# Patient Record
Sex: Female | Born: 1985 | Race: Black or African American | Hispanic: No | Marital: Single | Smoking: Never smoker
Health system: Southern US, Community
[De-identification: ages and names within clinical notes are randomized; demographics above are authoritative.]

## PROBLEM LIST (undated history)

## (undated) ENCOUNTER — Inpatient Hospital Stay (HOSPITAL_COMMUNITY): Payer: Self-pay

## (undated) DIAGNOSIS — G43909 Migraine, unspecified, not intractable, without status migrainosus: Secondary | ICD-10-CM

## (undated) DIAGNOSIS — E7800 Pure hypercholesterolemia, unspecified: Secondary | ICD-10-CM

## (undated) HISTORY — PX: WISDOM TOOTH EXTRACTION: SHX21

---

## 2014-06-01 ENCOUNTER — Encounter (HOSPITAL_COMMUNITY): Payer: Self-pay | Admitting: *Deleted

## 2014-06-01 ENCOUNTER — Inpatient Hospital Stay (HOSPITAL_COMMUNITY)
Admission: AD | Admit: 2014-06-01 | Discharge: 2014-06-01 | Disposition: A | Discharge status: Home / Self Care | Payer: Medicaid - Out of State | Source: Ambulatory Visit | Attending: Obstetrics & Gynecology | Admitting: Obstetrics & Gynecology

## 2014-06-01 DIAGNOSIS — R1013 Epigastric pain: Secondary | ICD-10-CM | POA: Diagnosis present

## 2014-06-01 DIAGNOSIS — N898 Other specified noninflammatory disorders of vagina: Secondary | ICD-10-CM | POA: Insufficient documentation

## 2014-06-01 DIAGNOSIS — R197 Diarrhea, unspecified: Secondary | ICD-10-CM

## 2014-06-01 HISTORY — DX: Pure hypercholesterolemia, unspecified: E78.00

## 2014-06-01 LAB — CBC WITH DIFFERENTIAL/PLATELET
Basophils Absolute: 0 10*3/uL (ref 0.0–0.1)
Basophils Relative: 0 % (ref 0–1)
Eosinophils Absolute: 0.4 10*3/uL (ref 0.0–0.7)
Eosinophils Relative: 8 % — ABNORMAL HIGH (ref 0–5)
HEMATOCRIT: 38.4 % (ref 36.0–46.0)
HEMOGLOBIN: 12.9 g/dL (ref 12.0–15.0)
LYMPHS PCT: 47 % — AB (ref 12–46)
Lymphs Abs: 2.5 10*3/uL (ref 0.7–4.0)
MCH: 27.9 pg (ref 26.0–34.0)
MCHC: 33.6 g/dL (ref 30.0–36.0)
MCV: 83.1 fL (ref 78.0–100.0)
MONO ABS: 0.4 10*3/uL (ref 0.1–1.0)
MONOS PCT: 7 % (ref 3–12)
Neutro Abs: 2.1 10*3/uL (ref 1.7–7.7)
Neutrophils Relative %: 38 % — ABNORMAL LOW (ref 43–77)
Platelets: 245 10*3/uL (ref 150–400)
RBC: 4.62 MIL/uL (ref 3.87–5.11)
RDW: 15.7 % — ABNORMAL HIGH (ref 11.5–15.5)
WBC: 5.4 10*3/uL (ref 4.0–10.5)

## 2014-06-01 LAB — URINALYSIS, ROUTINE W REFLEX MICROSCOPIC
BILIRUBIN URINE: NEGATIVE
GLUCOSE, UA: NEGATIVE mg/dL
Ketones, ur: NEGATIVE mg/dL
Leukocytes, UA: NEGATIVE
Nitrite: NEGATIVE
PROTEIN: NEGATIVE mg/dL
Specific Gravity, Urine: 1.02 (ref 1.005–1.030)
Urobilinogen, UA: 0.2 mg/dL (ref 0.0–1.0)
pH: 6.5 (ref 5.0–8.0)

## 2014-06-01 LAB — WET PREP, GENITAL
Trich, Wet Prep: NONE SEEN
YEAST WET PREP: NONE SEEN

## 2014-06-01 LAB — URINE MICROSCOPIC-ADD ON

## 2014-06-01 LAB — POCT PREGNANCY, URINE: PREG TEST UR: NEGATIVE

## 2014-06-01 LAB — HIV ANTIBODY (ROUTINE TESTING W REFLEX): HIV: NONREACTIVE

## 2014-06-01 MED ORDER — DICYCLOMINE HCL 10 MG PO CAPS
20.0000 mg | ORAL_CAPSULE | Freq: Once | ORAL | Status: AC
  Administered 2014-06-01: 20 mg via ORAL
  Filled 2014-06-01: qty 2

## 2014-06-01 MED ORDER — DICYCLOMINE HCL 20 MG PO TABS
20.0000 mg | ORAL_TABLET | Freq: Once | ORAL | Status: DC
  Filled 2014-06-01: qty 1

## 2014-06-01 MED ORDER — DICYCLOMINE HCL 20 MG PO TABS: 20.0000 mg | ORAL_TABLET | Freq: Four times a day (QID) | ORAL | Status: DC

## 2014-06-01 NOTE — Discharge Instructions (Signed)

## 2014-06-01 NOTE — MAU Note (Signed)
Also notes chest heaviness when lying down at night. Helps when pt lifts up her breasts and inhales deeply. Unsure if pain is r/t constipation.

## 2014-06-01 NOTE — MAU Note (Addendum)
Pt states here for sharp intermittent abd pain. Has been constipated and used enema with minimal results. Then took mag citrate and has had watery stools, however nothing solid. Has nexplanon and cycles went from every 28 days to every 18. Also has vaginal discharge with a slight odor. Was light brown, now is a chocolate color. Back pain as well that began today. Denies uti s/s.

## 2014-06-01 NOTE — MAU Provider Note (Signed)
History     CSN: [id number]  Arrival date and time: 06/01/14 1721   None     Chief Complaint  Patient presents with  . Abdominal Pain  . Vaginal Discharge   HPI  Pt presents with diffuse abd pain and midline epigastric pain onset 5d ago. Pt reports abd distension and constipation onset 5d ago, minimal relief with laxative 5d ago and magnesium citrate taken 3d ago. Pt reports mag citrate experienced profuse water diarrhea with minimal stool. Pt denies n/v, but reports unable to tolerate PO intake due to feeling of fullness. Pt reports abd distension "sometimes it makes me short of breath but when I burp it gets better". Pt denies fever/chills, hematuria, dysuria, melena.  Pt also reports new sexual contact 5d ago and experienced pain during unprotected sex. Pt then reports brown thin foul smelling vaginal discharge. Pt denies pelvic or vaginal pain. Pt reports previously had irregular vaginal bleeding.   OB History    Gravida Para Term Preterm AB TAB SAB Ectopic Multiple Living   0 0 0 0 0 0 0 0 0 0       Past Medical History  Diagnosis Date  . Hypercholesteremia     Past Surgical History  Procedure Laterality Date  . Wisdom tooth extraction      History reviewed. No pertinent family history.  History  Substance Use Topics  . Smoking status: Never Smoker   . Smokeless tobacco: Not on file  . Alcohol Use: Yes     Comment: occas.    Allergies: Allergies not on file  No prescriptions prior to admission    Review of Systems  Constitutional: Negative for fever, chills, weight loss, malaise/fatigue and diaphoresis.  Respiratory: Positive for shortness of breath. Negative for cough and wheezing.   Cardiovascular: Negative for chest pain.  Gastrointestinal: Positive for diarrhea and constipation. Negative for heartburn, nausea, vomiting, blood in stool and melena.  Genitourinary: Negative for dysuria, urgency, frequency, hematuria and flank pain.  Neurological: Negative  for weakness.   Physical Exam   Blood pressure 117/80, pulse 70, temperature 97.9 F (36.6 C), temperature source Oral, resp. rate 16, height 5\' 6"  (1.676 m), weight 232 lb (105.235 kg), last menstrual period 05/01/2014.  Physical Exam  Constitutional: She is oriented to person, place, and time. She appears well-developed and well-nourished.  HENT:  Head: Normocephalic.  Cardiovascular: Normal rate and normal heart sounds.   Respiratory: Effort normal and breath sounds normal. No respiratory distress. She has no wheezes.  GI: Soft. Bowel sounds are normal. She exhibits no distension and no mass. There is no tenderness. There is no rebound and no guarding.  Genitourinary: Vagina normal and uterus normal.  Thin purulent brown discharge noted in vaginal vault, unable to visualize cervix. Pt has no CMT with bimanual exam, no hemorrhagic bleeding noted.  Musculoskeletal: She exhibits no edema.  Neurological: She is alert and oriented to person, place, and time.  Skin: Skin is warm and dry.   Results for orders placed or performed during the hospital encounter of 06/01/14 (from the past 24 hour(s))  Urinalysis, Routine w reflex microscopic     Status: Abnormal   Collection Time: 06/01/14  5:34 PM  Result Value Ref Range   Color, Urine YELLOW YELLOW   APPearance CLEAR CLEAR   Specific Gravity, Urine 1.020 1.005 - 1.030   pH 6.5 5.0 - 8.0   Glucose, UA NEGATIVE NEGATIVE mg/dL   Hgb urine dipstick LARGE (A) NEGATIVE   Bilirubin Urine NEGATIVE  NEGATIVE   Ketones, ur NEGATIVE NEGATIVE mg/dL   Protein, ur NEGATIVE NEGATIVE mg/dL   Urobilinogen, UA 0.2 0.0 - 1.0 mg/dL   Nitrite NEGATIVE NEGATIVE   Leukocytes, UA NEGATIVE NEGATIVE  Urine microscopic-add on     Status: Abnormal   Collection Time: 06/01/14  5:34 PM  Result Value Ref Range   Squamous Epithelial / LPF FEW (A) RARE   WBC, UA 0-2 <3 WBC/hpf   RBC / HPF 3-6 <3 RBC/hpf   Bacteria, UA FEW (A) RARE  Pregnancy, urine POC      Status: None   Collection Time: 06/01/14  5:52 PM  Result Value Ref Range   Preg Test, Ur NEGATIVE NEGATIVE  Wet prep, genital     Status: Abnormal   Collection Time: 06/01/14  7:20 PM  Result Value Ref Range   Yeast Wet Prep HPF POC NONE SEEN NONE SEEN   Trich, Wet Prep NONE SEEN NONE SEEN   Clue Cells Wet Prep HPF POC FEW (A) NONE SEEN   WBC, Wet Prep HPF POC FEW (A) NONE SEEN  CBC with Differential     Status: Abnormal   Collection Time: 06/01/14  7:43 PM  Result Value Ref Range   WBC 5.4 4.0 - 10.5 K/uL   RBC 4.62 3.87 - 5.11 MIL/uL   Hemoglobin 12.9 12.0 - 15.0 g/dL   HCT 16.138.4 09.636.0 - 04.546.0 %   MCV 83.1 78.0 - 100.0 fL   MCH 27.9 26.0 - 34.0 pg   MCHC 33.6 30.0 - 36.0 g/dL   RDW 40.915.7 (H) 81.111.5 - 91.415.5 %   Platelets 245 150 - 400 K/uL   Neutrophils Relative % 38 (L) 43 - 77 %   Neutro Abs 2.1 1.7 - 7.7 K/uL   Lymphocytes Relative 47 (H) 12 - 46 %   Lymphs Abs 2.5 0.7 - 4.0 K/uL   Monocytes Relative 7 3 - 12 %   Monocytes Absolute 0.4 0.1 - 1.0 K/uL   Eosinophils Relative 8 (H) 0 - 5 %   Eosinophils Absolute 0.4 0.0 - 0.7 K/uL   Basophils Relative 0 0 - 1 %   Basophils Absolute 0.0 0.0 - 0.1 K/uL    MAU Course  Procedures  MDM Swabs sent from pelvic exam due to discharge, CBC to eval infection. Pt abd pain symptomatic of gas and recent hypermotive state with laxatives. Treat with bentyl for cramping pain, PO trial and re-eval.  Assessment and Plan    [facility] LLCWILLIAMS,MARIE 06/01/2014, 7:01 PM   Agree with note Report to oncoming shift Aviva SignsMarie L Williams, CNM   2000 - Care assumed from Wynelle BourgeoisMarie Williams, CNM. Lab pending.  CBC shows normal WBCs Patient reports improvement in abdominal pain and fullness  A: Diarrhea secondary to laxatives  P: Discharge home Rx for Bentyl x 3 days sent to patient's pharmacy Patient advised to follow-up with [facility] or [facility] if symptoms persist or worsen Patient may return to MAU as needed or if her condition were to change or worsen   Marny LowensteinJulie  N Wenzel, PA-C 06/01/2014 8:32 PM

## 2014-06-02 LAB — GC/CHLAMYDIA PROBE AMP
CT PROBE, AMP APTIMA: NEGATIVE
GC Probe RNA: NEGATIVE

## 2014-06-19 ENCOUNTER — Encounter (HOSPITAL_COMMUNITY): Payer: Self-pay | Admitting: Emergency Medicine

## 2014-06-19 ENCOUNTER — Emergency Department (HOSPITAL_COMMUNITY): Payer: Medicaid - Out of State

## 2014-06-19 ENCOUNTER — Emergency Department (HOSPITAL_COMMUNITY)
Admission: EM | Admit: 2014-06-19 | Discharge: 2014-06-19 | Disposition: A | Discharge status: Home / Self Care | Payer: Medicaid - Out of State | Attending: Emergency Medicine | Admitting: Emergency Medicine

## 2014-06-19 DIAGNOSIS — S46812A Strain of other muscles, fascia and tendons at shoulder and upper arm level, left arm, initial encounter: Secondary | ICD-10-CM

## 2014-06-19 DIAGNOSIS — Y9289 Other specified places as the place of occurrence of the external cause: Secondary | ICD-10-CM | POA: Diagnosis not present

## 2014-06-19 DIAGNOSIS — S4992XA Unspecified injury of left shoulder and upper arm, initial encounter: Secondary | ICD-10-CM | POA: Diagnosis present

## 2014-06-19 DIAGNOSIS — Y9389 Activity, other specified: Secondary | ICD-10-CM | POA: Diagnosis not present

## 2014-06-19 DIAGNOSIS — Z8639 Personal history of other endocrine, nutritional and metabolic disease: Secondary | ICD-10-CM | POA: Insufficient documentation

## 2014-06-19 DIAGNOSIS — X58XXXA Exposure to other specified factors, initial encounter: Secondary | ICD-10-CM | POA: Insufficient documentation

## 2014-06-19 DIAGNOSIS — M542 Cervicalgia: Secondary | ICD-10-CM

## 2014-06-19 DIAGNOSIS — S46912A Strain of unspecified muscle, fascia and tendon at shoulder and upper arm level, left arm, initial encounter: Secondary | ICD-10-CM | POA: Diagnosis not present

## 2014-06-19 DIAGNOSIS — M62838 Other muscle spasm: Secondary | ICD-10-CM

## 2014-06-19 DIAGNOSIS — Y998 Other external cause status: Secondary | ICD-10-CM | POA: Diagnosis not present

## 2014-06-19 MED ORDER — CYCLOBENZAPRINE HCL 10 MG PO TABS: 10.0000 mg | ORAL_TABLET | Freq: Three times a day (TID) | ORAL | Status: DC | PRN

## 2014-06-19 MED ORDER — OXYCODONE-ACETAMINOPHEN 5-325 MG PO TABS
1.0000 | ORAL_TABLET | Freq: Once | ORAL | Status: AC
  Administered 2014-06-19: 1 via ORAL
  Filled 2014-06-19: qty 1

## 2014-06-19 MED ORDER — IBUPROFEN 800 MG PO TABS: 800.0000 mg | ORAL_TABLET | Freq: Three times a day (TID) | ORAL | Status: DC | PRN

## 2014-06-19 MED ORDER — DIAZEPAM 5 MG PO TABS
5.0000 mg | ORAL_TABLET | Freq: Once | ORAL | Status: AC
  Administered 2014-06-19: 5 mg via ORAL
  Filled 2014-06-19: qty 1

## 2014-06-19 MED ORDER — HYDROCODONE-ACETAMINOPHEN 5-325 MG PO TABS: 1.0000 | ORAL_TABLET | Freq: Four times a day (QID) | ORAL | Status: DC | PRN

## 2014-06-19 MED ORDER — KETOROLAC TROMETHAMINE 60 MG/2ML IM SOLN
60.0000 mg | Freq: Once | INTRAMUSCULAR | Status: DC
Start: 2014-06-19 — End: 2014-06-19
  Filled 2014-06-19: qty 2

## 2014-06-19 NOTE — ED Notes (Addendum)
Pt states she woke up this morning with left sided shoulder and neck pain. Pt states its like a crick in her neck but worse. Pt denies injury. Pt reports pain has been consistent for about 45 minutes. Pt is alert and oriented, states she has not taken any medication to help relieve pain.

## 2014-06-19 NOTE — ED Provider Notes (Signed)
CSN: [id number]     Arrival date & time 06/19/14  4098 History   First MD Initiated Contact with Patient 06/19/14 [phone]     Chief Complaint  Patient presents with  . Shoulder Pain  . Neck Pain     (Consider location/radiation/quality/duration/timing/severity/associated sxs/prior Treatment) Patient is a 29 y.o. female presenting with shoulder pain and neck pain.  Shoulder Pain Associated symptoms: neck pain   Neck Pain  Patient presents to the emergency department with left-sided neck pain that radiates to her left shoulder.  The patient states that she woke up with this pain.  She states she has never had any neck issues in the past.  The patient denies fever, nausea, vomiting, diarrhea, weakness, headache, blurred vision, back pain, numbness, chest pain, shortness of breath, abdominal pain, dysuria, fever, rash, or syncope.  The patient states that she did not take any medications prior to arrival.  Movement and palpation make the pain worse  Past Medical History  Diagnosis Date  . Hypercholesteremia    Past Surgical History  Procedure Laterality Date  . Wisdom tooth extraction     History reviewed. No pertinent family history. History  Substance Use Topics  . Smoking status: Never Smoker   . Smokeless tobacco: Not on file  . Alcohol Use: Yes     Comment: occas.   OB History    Gravida Para Term Preterm AB TAB SAB Ectopic Multiple Living       Review of Systems  Musculoskeletal: Positive for neck pain.    All other systems negative except as documented in the HPI. All pertinent positives and negatives as reviewed in the HPI.   Allergies  Mushroom extract complex and Zithromax  Home Medications   Prior to Admission medications   Medication Sig Start Date End Date Taking? Authorizing Provider  dicyclomine (BENTYL) 20 MG tablet Take 1 tablet (20 mg total) by mouth every 6 (six) hours. 06/01/14  Yes Marny Lowenstein, PA-C  etonogestrel (NEXPLANON) 68  MG IMPL implant 1 each by Subdermal route continuous.   Yes Historical Provider, MD   BP 124/78 mmHg  Pulse 81  Temp(Src) 98.2 F (36.8 C) (Oral)  Resp 16  SpO2 99%  LMP 06/18/2014 Physical Exam  Constitutional: She is oriented to person, place, and time. She appears well-developed and well-nourished. No distress.  HENT:  Head: Normocephalic and atraumatic.  Mouth/Throat: Oropharynx is clear and moist.  Eyes: Pupils are equal, round, and reactive to light.  Neck: Normal range of motion. Neck supple.  Cardiovascular: Normal rate, regular rhythm and normal heart sounds.  Exam reveals no gallop and no friction rub.   No murmur heard. Pulmonary/Chest: Effort normal and breath sounds normal. No respiratory distress. She has no wheezes.  Musculoskeletal: She exhibits no edema.       Cervical back: She exhibits tenderness and pain. She exhibits no bony tenderness, no swelling, no deformity, no spasm and normal pulse.       Back:  Neurological: She is alert and oriented to person, place, and time. She has normal reflexes. She exhibits normal muscle tone. Coordination normal.  Skin: Skin is warm and dry. No rash noted. No erythema.  Nursing note and vitals reviewed.   ED Course  Procedures (including critical care time)   Imaging Review Dg Cervical Spine Complete  06/19/2014   CLINICAL DATA:  Left-sided neck pain and paresthesias of left upper extremity.  EXAM: CERVICAL SPINE -  COMPLETE 4+ VIEW  COMPARISON:  None.  FINDINGS: The cervical spine shows normal alignment. No evidence of fracture or subluxation. No significant spondylosis identified. Neural foramina are widely patent bilaterally. No soft tissue swelling. No bony lesions identified.  IMPRESSION: Normal cervical spine radiographs.   Electronically Signed   By: Irish Lack M.D.   On: 06/19/2014 08:12   Patient has a trapezius muscle strain and spasm.  No neurological deficits noted on exam.  She is advised to return here as  needed.  Will be given pain control and told to use ice and heat on her neck   MDM   Final diagnoses:  Neck pain       Carlyle Dolly, PA-C 06/19/14 [id number]  Elwin Mocha, MD 06/20/14 [phone]

## 2014-06-19 NOTE — ED Notes (Signed)
Patient transported to X-ray 

## 2014-06-19 NOTE — Discharge Instructions (Signed)
Return here as needed.  Follow up with a primary care doctor.  Use ice and heat on your neck.  Your x-rays did not show any abnormality

## 2015-07-20 ENCOUNTER — Emergency Department (HOSPITAL_COMMUNITY)
Admission: EM | Admit: 2015-07-20 | Discharge: 2015-07-21 | Disposition: A | Discharge status: Home / Self Care | Payer: Medicaid - Out of State | Attending: Emergency Medicine | Admitting: Emergency Medicine

## 2015-07-20 ENCOUNTER — Encounter (HOSPITAL_COMMUNITY): Payer: Self-pay | Admitting: *Deleted

## 2015-07-20 DIAGNOSIS — G43009 Migraine without aura, not intractable, without status migrainosus: Secondary | ICD-10-CM

## 2015-07-20 DIAGNOSIS — G43909 Migraine, unspecified, not intractable, without status migrainosus: Secondary | ICD-10-CM | POA: Diagnosis not present

## 2015-07-20 DIAGNOSIS — E876 Hypokalemia: Secondary | ICD-10-CM | POA: Insufficient documentation

## 2015-07-20 DIAGNOSIS — Z8639 Personal history of other endocrine, nutritional and metabolic disease: Secondary | ICD-10-CM | POA: Insufficient documentation

## 2015-07-20 DIAGNOSIS — Z3202 Encounter for pregnancy test, result negative: Secondary | ICD-10-CM | POA: Diagnosis not present

## 2015-07-20 DIAGNOSIS — Z793 Long term (current) use of hormonal contraceptives: Secondary | ICD-10-CM | POA: Insufficient documentation

## 2015-07-20 DIAGNOSIS — R112 Nausea with vomiting, unspecified: Secondary | ICD-10-CM | POA: Diagnosis present

## 2015-07-20 LAB — URINALYSIS, ROUTINE W REFLEX MICROSCOPIC
Bilirubin Urine: NEGATIVE
Glucose, UA: NEGATIVE mg/dL
Ketones, ur: NEGATIVE mg/dL
Leukocytes, UA: NEGATIVE
NITRITE: NEGATIVE
PROTEIN: NEGATIVE mg/dL
SPECIFIC GRAVITY, URINE: 1.011 (ref 1.005–1.030)
pH: 6 (ref 5.0–8.0)

## 2015-07-20 LAB — CBC
HCT: 31.5 % — ABNORMAL LOW (ref 36.0–46.0)
HEMOGLOBIN: 10.3 g/dL — AB (ref 12.0–15.0)
MCH: 24.7 pg — ABNORMAL LOW (ref 26.0–34.0)
MCHC: 32.7 g/dL (ref 30.0–36.0)
MCV: 75.5 fL — ABNORMAL LOW (ref 78.0–100.0)
Platelets: 342 10*3/uL (ref 150–400)
RBC: 4.17 MIL/uL (ref 3.87–5.11)
RDW: 16.4 % — ABNORMAL HIGH (ref 11.5–15.5)
WBC: 6.5 10*3/uL (ref 4.0–10.5)

## 2015-07-20 LAB — COMPREHENSIVE METABOLIC PANEL
ALK PHOS: 75 U/L (ref 38–126)
ALT: 12 U/L — ABNORMAL LOW (ref 14–54)
ANION GAP: 12 (ref 5–15)
AST: 16 U/L (ref 15–41)
Albumin: 3.5 g/dL (ref 3.5–5.0)
BUN: 7 mg/dL (ref 6–20)
CO2: 24 mmol/L (ref 22–32)
Calcium: 8.9 mg/dL (ref 8.9–10.3)
Chloride: 105 mmol/L (ref 101–111)
Creatinine, Ser: 0.89 mg/dL (ref 0.44–1.00)
Glucose, Bld: 111 mg/dL — ABNORMAL HIGH (ref 65–99)
Potassium: 3.3 mmol/L — ABNORMAL LOW (ref 3.5–5.1)
SODIUM: 141 mmol/L (ref 135–145)
TOTAL PROTEIN: 7 g/dL (ref 6.5–8.1)
Total Bilirubin: 0.3 mg/dL (ref 0.3–1.2)

## 2015-07-20 LAB — URINE MICROSCOPIC-ADD ON: WBC UA: NONE SEEN WBC/hpf (ref 0–5)

## 2015-07-20 LAB — LIPASE, BLOOD: Lipase: 29 U/L (ref 11–51)

## 2015-07-20 NOTE — ED Notes (Signed)
Pt states that she has been having headaches, facial "soreness", nausea and vomiting x 2 days. Pt denies fevers.

## 2015-07-21 LAB — PREGNANCY, URINE: PREG TEST UR: NEGATIVE

## 2015-07-21 MED ORDER — PROMETHAZINE HCL 25 MG PO TABS: 25.0000 mg | ORAL_TABLET | Freq: Four times a day (QID) | ORAL | Status: DC | PRN

## 2015-07-21 MED ORDER — POTASSIUM CHLORIDE CRYS ER 20 MEQ PO TBCR
40.0000 meq | EXTENDED_RELEASE_TABLET | Freq: Once | ORAL | Status: AC
  Administered 2015-07-21: 40 meq via ORAL
  Filled 2015-07-21: qty 2

## 2015-07-21 MED ORDER — KETOROLAC TROMETHAMINE 30 MG/ML IJ SOLN
30.0000 mg | Freq: Once | INTRAMUSCULAR | Status: AC
  Administered 2015-07-21: 30 mg via INTRAVENOUS
  Filled 2015-07-21: qty 1

## 2015-07-21 MED ORDER — SODIUM CHLORIDE 0.9 % IV BOLUS (SEPSIS)
1000.0000 mL | Freq: Once | INTRAVENOUS | Status: AC
  Administered 2015-07-21: 1000 mL via INTRAVENOUS

## 2015-07-21 MED ORDER — BUTALBITAL-APAP-CAFFEINE 50-325-40 MG PO TABS: 1.0000 | ORAL_TABLET | Freq: Four times a day (QID) | ORAL | Status: DC | PRN

## 2015-07-21 MED ORDER — DIPHENHYDRAMINE HCL 50 MG/ML IJ SOLN
50.0000 mg | Freq: Once | INTRAMUSCULAR | Status: AC
  Administered 2015-07-21: 50 mg via INTRAVENOUS
  Filled 2015-07-21: qty 1

## 2015-07-21 MED ORDER — METOCLOPRAMIDE HCL 5 MG/ML IJ SOLN
10.0000 mg | Freq: Once | INTRAMUSCULAR | Status: AC
  Administered 2015-07-21: 10 mg via INTRAVENOUS
  Filled 2015-07-21: qty 2

## 2015-07-21 MED ORDER — DEXAMETHASONE SODIUM PHOSPHATE 10 MG/ML IJ SOLN
10.0000 mg | Freq: Once | INTRAMUSCULAR | Status: AC
  Administered 2015-07-21: 10 mg via INTRAVENOUS
  Filled 2015-07-21: qty 1

## 2015-07-21 NOTE — ED Provider Notes (Addendum)
By signing my name below, I, Rohini Rajnarayanan, attest that this documentation has been prepared under the direction and in the presence of Layla Maw Ward, DO Electronically Signed: Charlean Merl, ED Scribe 07/21/2015 at 2:06 AM.  TIME SEEN: 1:55 AM  CHIEF COMPLAINT:  Chief Complaint  Patient presents with  . Emesis    HPI: HPI Comments: Virginia Singleton is a 30 y.o. female who presents to the Emergency Department complaining of a global, throbbing, continuous, gradual onset headache which began 2 days ago. Pt reports associated nausea, vomiting, and facial soreness. Pain is exacerbated by light and sound. Pt took  of Ibuprofen with no relief. She denies experiencing any similar sx in the past. Pt denies any head injury. Pt denies numbness, tingling or focal weakness. Denies fever, diarrhea, dysuria, hematuria, vaginal bleeding or discharge. Denies sudden onset, thunderclap headache.  ROS: See HPI Constitutional: no fever  Eyes: no drainage, photophobia ENT: no runny nose, phonophobia Cardiovascular:  no chest pain  Resp: no SOB  GI: vomiting, nausea GU: no dysuria Integumentary: no rash  Allergy: no hives  Musculoskeletal: no leg swelling  Neurological: no slurred speech ROS otherwise negative  PAST MEDICAL HISTORY/PAST SURGICAL HISTORY:  Past Medical History  Diagnosis Date  . Hypercholesteremia     MEDICATIONS:  Prior to Admission medications   Medication Sig Start Date End Date Taking? Authorizing Provider  etonogestrel (NEXPLANON) 68 MG IMPL implant 1 each by Subdermal route continuous.   Yes Historical Provider, MD  ibuprofen (ADVIL,MOTRIN) 800 MG tablet Take 1 tablet (800 mg total) by mouth every 8 (eight) hours as needed. Patient taking differently: Take 800 mg by mouth every 8 (eight) hours as needed for moderate pain.  06/19/14  Yes Charlestine Night, PA-C    ALLERGIES:  Allergies  Allergen Reactions  . Mushroom Extract Complex Hives and Itching   . Zithromax [Azithromycin] Hives and Rash    SOCIAL HISTORY:  Social History  Substance Use Topics  . Smoking status: Never Smoker   . Smokeless tobacco: Not on file  . Alcohol Use: Yes     Comment: occas.    FAMILY HISTORY: No family history on file.  EXAM: BP 131/87 mmHg  Pulse 84  Temp(Src) 99.2 F (37.3 C) (Oral)  Resp 18  Ht  (1.676 m)  Wt 245 lb (111.131 kg)  BMI 39.56 kg/m2  SpO2 100%  LMP 07/03/2015 CONSTITUTIONAL: Alert and oriented and responds appropriately to questions. Well-appearing; well-nourished HEAD: Normocephalic EYES: Conjunctivae clear, PERRL, photophobia ENT: normal nose; no rhinorrhea; moist mucous membranes; pharynx without lesions noted NECK: Supple, no meningismus, no LAD  CARD: RRR; S1 and S2 appreciated; no murmurs, no clicks, no rubs, no gallops RESP: Normal chest excursion without splinting or tachypnea; breath sounds clear and equal bilaterally; no wheezes, no rhonchi, no rales, no hypoxia or respiratory distress, speaking full sentences ABD/GI: Normal bowel sounds; non-distended; soft, non-tender, no rebound, no guarding, no peritoneal signs BACK:  The back appears normal and is non-tender to palpation, there is no CVA tenderness EXT: Normal ROM in all joints; non-tender to palpation; no edema; normal capillary refill; no cyanosis, no calf tenderness or swelling    SKIN: Normal color for age and race; warm NEURO: Moves all extremities equally, sensation to light touch intact diffusely, cranial nerves II through XII intact. Strength 5/5 in all 4 extremities. Normal gait. PSYCH: The patient's mood and manner are appropriate. Grooming and personal hygiene are appropriate.  MEDICAL DECISION MAKING: Patient here with what appears  to be migraine headache. She does have mildly low potassium seen in labs ordered in triage. Urine shows small amount of blood but no other sign of infection. Pregnancy test is negative. Will treat with IV fluids,  Toradol, Reglan, Benadryl, Decadron and reassess. I do not feel she needs emergent head imaging. I do not think this is infectious in nature or subarachnoid hemorrhage.  ED PROGRESS:   3:26 AM - pt is now headache free and tolerating PO. Feeling better, hemodynamically stable. Ready to be discharged home. Again I suspect this is a migraine headache. We'll discharge with prescription for Fioricet and Phenergan to take as needed if symptoms return. Will get outpatient follow-up. Discussed return precautions with patient and her significant other. They verbalize understanding and are comfortable with this plan.    Layla Maw Ward, DO 07/21/15 [phone]   I personally performed the services described in this documentation, which was scribed in my presence. The recorded information has been reviewed and is accurate.   Layla Maw Ward, DO 07/21/15 [phone]

## 2015-07-21 NOTE — Discharge Instructions (Signed)

## 2015-11-27 ENCOUNTER — Emergency Department (HOSPITAL_COMMUNITY)
Admission: EM | Admit: 2015-11-27 | Discharge: 2015-11-27 | Disposition: A | Discharge status: Home / Self Care | Payer: Medicaid - Out of State | Attending: Emergency Medicine | Admitting: Emergency Medicine

## 2015-11-27 ENCOUNTER — Encounter (HOSPITAL_COMMUNITY): Payer: Self-pay | Admitting: Emergency Medicine

## 2015-11-27 DIAGNOSIS — N939 Abnormal uterine and vaginal bleeding, unspecified: Secondary | ICD-10-CM | POA: Insufficient documentation

## 2015-11-27 DIAGNOSIS — Z79899 Other long term (current) drug therapy: Secondary | ICD-10-CM | POA: Diagnosis not present

## 2015-11-27 NOTE — ED Notes (Signed)
Pt called out and stated she had a few questions. When this RN went in to speak to pt, pt asked if [facility].  This RN explained that they will probably do the same tests, and there is no guarantee they would be able to remove the implant today emergently.  Pt verbalized understanding.  PA made aware and gave pt counseling.  [facility] decided to [facility]  PA made aware, will discharge.

## 2015-11-27 NOTE — ED Provider Notes (Signed)
CSN: [id number]     Arrival date & time 11/27/15  1928 History   First MD Initiated Contact with Patient 11/27/15 2013     Chief Complaint  Patient presents with  . Vaginal Bleeding     (Consider location/radiation/quality/duration/timing/severity/associated sxs/prior Treatment) HPI Comments: Patient presents with complaint of "I just want to stop bleeding". Patient has had heavy vaginal bleeding with clots starting in April 2017. Patient states that she had bleeding during the entire month. She had 2 months in the beginning of May where she did not have bleeding, however has had bleeding since that time. She is currently going through a pad approximately every 2 hours. Patient recently moved to the area from [place]. She has had an Implanon in place since 01/2013. She reports fatigue and decreased exercise tolerance. No shortness of breath at rest or lightheadedness reported. She has associated lower abdominal and back cramps. Also complains of a headache. Occasional nausea but no vomiting. No diarrhea or urinary symptoms. Patient does not have a gynecologist here. Onset of symptoms acute. Course is constant. Nothing makes symptoms better or worse.  Patient is a 30 y.o. female presenting with vaginal bleeding. The history is provided by the patient.  Vaginal Bleeding Associated symptoms: back pain, fatigue and nausea   Associated symptoms: no abdominal pain, no dysuria, no fever and no vaginal discharge     Past Medical History  Diagnosis Date  . Hypercholesteremia    Past Surgical History  Procedure Laterality Date  . Wisdom tooth extraction     No family history on file. Social History  Substance Use Topics  . Smoking status: Never Smoker   . Smokeless tobacco: None  . Alcohol Use: Yes     Comment: occas.   OB History    Gravida Para Term Preterm AB TAB SAB Ectopic Multiple Living       Review of Systems  Constitutional: Positive for fatigue. Negative  for fever.  HENT: Negative for rhinorrhea and sore throat.   Eyes: Negative for redness.  Respiratory: Negative for cough.   Cardiovascular: Negative for chest pain.  Gastrointestinal: Positive for nausea. Negative for vomiting, abdominal pain and diarrhea.  Genitourinary: Positive for vaginal bleeding and pelvic pain (cramps). Negative for dysuria and vaginal discharge.  Musculoskeletal: Positive for back pain. Negative for myalgias.  Skin: Negative for rash.  Neurological: Positive for headaches.      Allergies  Mushroom extract complex and Zithromax  Home Medications   Prior to Admission medications   Medication Sig Start Date End Date Taking? Authorizing Provider  butalbital-acetaminophen-caffeine (FIORICET) 50-325-40 MG tablet Take 1-2 tablets by mouth every 6 (six) hours as needed for headache. 07/21/15 07/20/16  Kristen N Ward, DO  etonogestrel (NEXPLANON) 68 MG IMPL implant 1 each by Subdermal route continuous.    Historical Provider, MD  promethazine (PHENERGAN) 25 MG tablet Take 1 tablet (25 mg total) by mouth every 6 (six) hours as needed for nausea or vomiting. 07/21/15   Kristen N Ward, DO   BP 133/85 mmHg  Pulse 87  Temp(Src) 99.5 F (37.5 C) (Oral)  Resp 14  SpO2 100%   Physical Exam  Constitutional: She appears well-developed and well-nourished.  HENT:  Head: Normocephalic and atraumatic.  Mouth/Throat: Oropharynx is clear and moist.  Eyes: Conjunctivae are normal. Right eye exhibits no discharge. Left eye exhibits no discharge.  Neck: Normal range of motion. Neck supple.  Cardiovascular: Normal rate, regular rhythm  and normal heart sounds.   No murmur heard. No tachycardia.   Pulmonary/Chest: Effort normal and breath sounds normal.  Abdominal: Soft. There is no tenderness. There is no rebound and no guarding.  Neurological: She is alert.  Skin: Skin is warm and dry.  Psychiatric: She has a normal mood and affect.  Nursing note and vitals reviewed.   ED  Course  Procedures (including critical care time) Labs Review Labs Reviewed  WET PREP, GENITAL  CBC  BASIC METABOLIC PANEL  I-STAT BETA HCG BLOOD, ED ([facility], [facility], [facility] ONLY)  GC/CHLAMYDIA PROBE AMP ([place]) NOT AT [facility]    8:25 PM Patient seen and examined. Work-up initiated. Patient requests that we remove her Nexplanon. I informed her that we are not trained to do this and that we will provide GYN referral for this.   Vital signs reviewed and are as follows: BP 133/85 mmHg  Pulse 87  Temp(Src) 99.5 F (37.5 C) (Oral)  Resp 14  SpO2 100%  8:50 PM [facility] and would like to be seen there. I have offered evaluation here with labs and pelvic exam, but she now declines. Will discharge. Vitals are stable. No indications for AMA discharge.    MDM   Final diagnoses:  Vaginal bleeding   Discharge without complete eval. She does not have any vital sign abnormalities suspicious for severe anemia.   Renne CriglerJoshua Mischelle Reeg, PA-C 11/27/15 2053  Jacalyn LefevreJulie Haviland, MD 11/27/15 [phone]

## 2015-11-27 NOTE — ED Notes (Signed)
Per pt, she has had increased vaginal bleeding since April. She an implant in her left arm.

## 2015-11-27 NOTE — Discharge Instructions (Signed)
Please read and follow all provided instructions.  Your diagnoses today include:  1. Vaginal bleeding    Tests performed today include:  Vital signs. See below for your results today.   Medications prescribed:   None  Take any prescribed medications only as directed.  Home care instructions:  Follow any educational materials contained in this packet.  Follow-up instructions: [facility].   Return instructions:   Please return to the Emergency Department if you experience worsening symptoms.   Return if you have worsening shortness of breath, pass out, or increased bleeding.  Please return if you have any other emergent concerns.  Additional Information:  Your vital signs today were: BP 133/85 mmHg   Pulse 87   Temp(Src) 99.5 F (37.5 C) (Oral)   Resp 14   SpO2 100% If your blood pressure (BP) was elevated above 135/85 this visit, please have this repeated by your doctor within one month. --------------

## 2015-11-27 NOTE — ED Notes (Signed)
Patient able to ambulate independently  

## 2015-11-28 ENCOUNTER — Encounter (HOSPITAL_COMMUNITY): Payer: Self-pay | Admitting: *Deleted

## 2015-11-28 ENCOUNTER — Inpatient Hospital Stay (HOSPITAL_COMMUNITY)
Admission: AD | Admit: 2015-11-28 | Discharge: 2015-11-28 | Disposition: A | Discharge status: Home / Self Care | Payer: Medicaid - Out of State | Source: Ambulatory Visit | Attending: Obstetrics and Gynecology | Admitting: Obstetrics and Gynecology

## 2015-11-28 DIAGNOSIS — Z975 Presence of (intrauterine) contraceptive device: Secondary | ICD-10-CM

## 2015-11-28 DIAGNOSIS — N939 Abnormal uterine and vaginal bleeding, unspecified: Secondary | ICD-10-CM | POA: Diagnosis not present

## 2015-11-28 DIAGNOSIS — N921 Excessive and frequent menstruation with irregular cycle: Secondary | ICD-10-CM | POA: Diagnosis not present

## 2015-11-28 DIAGNOSIS — E7800 Pure hypercholesterolemia, unspecified: Secondary | ICD-10-CM | POA: Diagnosis not present

## 2015-11-28 DIAGNOSIS — D500 Iron deficiency anemia secondary to blood loss (chronic): Secondary | ICD-10-CM

## 2015-11-28 LAB — CBC
HEMATOCRIT: 31.7 % — AB (ref 36.0–46.0)
Hemoglobin: 10.5 g/dL — ABNORMAL LOW (ref 12.0–15.0)
MCH: 24.6 pg — AB (ref 26.0–34.0)
MCHC: 33.1 g/dL (ref 30.0–36.0)
MCV: 74.4 fL — AB (ref 78.0–100.0)
Platelets: 326 10*3/uL (ref 150–400)
RBC: 4.26 MIL/uL (ref 3.87–5.11)
RDW: 17 % — ABNORMAL HIGH (ref 11.5–15.5)
WBC: 6 10*3/uL (ref 4.0–10.5)

## 2015-11-28 LAB — URINALYSIS, ROUTINE W REFLEX MICROSCOPIC
BILIRUBIN URINE: NEGATIVE
Glucose, UA: NEGATIVE mg/dL
Ketones, ur: NEGATIVE mg/dL
Leukocytes, UA: NEGATIVE
NITRITE: NEGATIVE
PH: 5.5 (ref 5.0–8.0)
Protein, ur: NEGATIVE mg/dL
SPECIFIC GRAVITY, URINE: 1.02 (ref 1.005–1.030)

## 2015-11-28 LAB — URINE MICROSCOPIC-ADD ON

## 2015-11-28 LAB — POCT PREGNANCY, URINE: Preg Test, Ur: NEGATIVE

## 2015-11-28 MED ORDER — FERROUS SULFATE 325 (65 FE) MG PO TABS: 325.0000 mg | ORAL_TABLET | Freq: Every day | ORAL | Status: DC

## 2015-11-28 NOTE — Discharge Instructions (Signed)
Abnormal Uterine Bleeding °Abnormal uterine bleeding means bleeding from the vagina that is not your normal menstrual period. This can be: °· Bleeding or spotting between periods. °· Bleeding after sex (sexual intercourse). °· Bleeding that is heavier or more than normal. °· Periods that last longer than usual. °· Bleeding after menopause. °There are many problems that may cause this. Treatment will depend on the cause of the bleeding. Any kind of bleeding that is not normal should be reviewed by your doctor.  °HOME CARE °Watch your condition for any changes. These actions may lessen any discomfort you are having: °· Do not use tampons or douches as told by your doctor. °· Change your pads often. °You should get regular pelvic exams and Pap tests. Keep all appointments for tests as told by your doctor. °GET HELP IF: °· You are bleeding for more than 1 week. °· You feel dizzy at times. °GET HELP RIGHT AWAY IF:  °· You pass out. °· You have to change pads every 15 to 30 minutes. °· You have belly pain. °· You have a fever. °· You become sweaty or weak. °· You are passing large blood clots from the vagina. °· You feel sick to your stomach (nauseous) and throw up (vomit). °MAKE SURE YOU: °· Understand these instructions. °· Will watch your condition. °· Will get help right away if you are not doing well or get worse. °  °This information is not intended to replace advice given to you by your health care provider. Make sure you discuss any questions you have with your health care provider. °  °Document Released: 04/01/2009 Document Revised: 06/09/2013 Document Reviewed: 01/01/2013 °Elsevier Interactive Patient Education ©2016 Elsevier Inc. ° °Anemia, Nonspecific °Anemia is a condition in which the concentration of red blood cells or hemoglobin in the blood is below normal. Hemoglobin is a substance in red blood cells that carries oxygen to the tissues of the body. Anemia results in not enough oxygen reaching these  tissues.  °CAUSES  °Common causes of anemia include:  °· Excessive bleeding. Bleeding may be internal or external. This includes excessive bleeding from periods (in women) or from the intestine.   °· Poor nutrition.   °· Chronic kidney, thyroid, and liver disease.  °· Bone marrow disorders that decrease red blood cell production. °· Cancer and treatments for cancer. °· HIV, AIDS, and their treatments. °· Spleen problems that increase red blood cell destruction. °· Blood disorders. °· Excess destruction of red blood cells due to infection, medicines, and autoimmune disorders. °SIGNS AND SYMPTOMS  °· Minor weakness.   °· Dizziness.   °· Headache. °· Palpitations.   °· Shortness of breath, especially with exercise.   °· Paleness. °· Cold sensitivity. °· Indigestion. °· Nausea. °· Difficulty sleeping. °· Difficulty concentrating. °Symptoms may occur suddenly or they may develop slowly.  °DIAGNOSIS  °Additional blood tests are often needed. These help your health care provider determine the best treatment. Your health care provider will check your stool for blood and look for other causes of blood loss.  °TREATMENT  °Treatment varies depending on the cause of the anemia. Treatment can include:  °· Supplements of iron, vitamin B12, or folic acid.   °· Hormone medicines.   °· A blood transfusion. This may be needed if blood loss is severe.   °· Hospitalization. This may be needed if there is significant continual blood loss.   °· Dietary changes. °· Spleen removal. °HOME CARE INSTRUCTIONS °Keep all follow-up appointments. It often takes many weeks to correct anemia, and having your health care   provider check on your condition and your response to treatment is very important. °SEEK IMMEDIATE MEDICAL CARE IF:  °· You develop extreme weakness, shortness of breath, or chest pain.   °· You become dizzy or have trouble concentrating. °· You develop heavy vaginal bleeding.   °· You develop a rash.   °· You have bloody or black,  tarry stools.   °· You faint.   °· You vomit up blood.   °· You vomit repeatedly.   °· You have abdominal pain. °· You have a fever or persistent symptoms for more than 2-3 days.   °· You have a fever and your symptoms suddenly get worse.   °· You are dehydrated.   °MAKE SURE YOU: °· Understand these instructions. °· Will watch your condition. °· Will get help right away if you are not doing well or get worse. °  °This information is not intended to replace advice given to you by your health care provider. Make sure you discuss any questions you have with your health care provider. °  °Document Released: 07/12/2004 Document Revised: 02/04/2013 Document Reviewed: 11/28/2012 °Elsevier Interactive Patient Education ©2016 Elsevier Inc. ° °

## 2015-11-28 NOTE — MAU Provider Note (Signed)
History     CSN: [id number]  Arrival date and time: 11/28/15 1005   First Provider Initiated Contact with Patient 11/28/15 1041       Chief Complaint  Patient presents with  . Vaginal Bleeding   HPI Virginia Singleton is a 30 y.o. G0P0000 female who presents for vaginal bleeding. [place]. Reports vaginal bleeding every day since the beginning of April. States sometimes heavy and sometimes light. Goes through ~ 8 pads per day; not always full or saturated when she changes. Reports lower abdominal cramping that she rates 9/10. Normally treats with ibuprofen but hasn't taken any today.   Was seen at [facility] last night for same complaint but per not left once she found out they wouldn't be able to remove her nexplanon. Called here & appt was made for her in [place] Wednesday at 1 pm for nexplanon removal.   OB History    Gravida Para Term Preterm AB TAB SAB Ectopic Multiple Living        Past Medical History  Diagnosis Date  . Hypercholesteremia     Past Surgical History  Procedure Laterality Date  . Wisdom tooth extraction      History reviewed. No pertinent family history.  Social History  Substance Use Topics  . Smoking status: Never Smoker   . Smokeless tobacco: None  . Alcohol Use: Yes     Comment: occas.    Allergies:  Allergies  Allergen Reactions  . Mushroom Extract Complex Hives and Itching  . Zithromax [Azithromycin] Hives and Rash    Prescriptions prior to admission  Medication Sig Dispense Refill Last Dose  . butalbital-acetaminophen-caffeine (FIORICET) 50-325-40 MG tablet Take 1-2 tablets by mouth every 6 (six) hours as needed for headache. 20 tablet 0   . etonogestrel (NEXPLANON) 68 MG IMPL implant 1 each by Subdermal route continuous.   07/21/2015 at Unknown time  . promethazine (PHENERGAN) 25 MG tablet Take 1 tablet (25 mg total) by mouth every 6 (six) hours as needed for nausea or vomiting. 15  tablet 0     Review of Systems  Constitutional: Negative.   HENT: Negative.   Cardiovascular: Negative.   Gastrointestinal: Positive for abdominal pain. Negative for nausea, vomiting, diarrhea and constipation.  Genitourinary: Negative for dysuria.       + vaginal bleeding   Physical Exam   Blood pressure 126/86, pulse 80, temperature 98.7 F (37.1 C), resp. rate 16, last menstrual period 11/28/2015.  Physical Exam  Nursing note and vitals reviewed. Constitutional: She is oriented to person, place, and time. She appears well-developed and well-nourished. No distress.  HENT:  Head: Normocephalic and atraumatic.  Eyes: Conjunctivae are normal. Right eye exhibits no discharge. Left eye exhibits no discharge. No scleral icterus.  Neck: Normal range of motion.  Cardiovascular: Normal rate, regular rhythm and normal heart sounds.   No murmur heard. Respiratory: Effort normal and breath sounds normal. No respiratory distress. She has no wheezes.  GI: Soft. Bowel sounds are normal. She exhibits no distension. There is no tenderness. There is no rebound.  Genitourinary:  Small amount of light red blood on pad (2x4cm)   Neurological: She is alert and oriented to person, place, and time.  Skin: Skin is warm and dry. She is not diaphoretic.  Psychiatric: She has a normal mood and affect. Her behavior is normal. Judgment and thought content normal.    MAU Course  Procedures Results for orders placed or performed during the hospital encounter of 11/28/15 (from the past 24 hour(s))  Urinalysis, Routine w reflex microscopic (not at [facility])     Status: Abnormal   Collection Time: 11/28/15 10:15 AM  Result Value Ref Range   Color, Urine YELLOW YELLOW   APPearance CLEAR CLEAR   Specific Gravity, Urine 1.020 1.005 - 1.030   pH 5.5 5.0 - 8.0   Glucose, UA NEGATIVE NEGATIVE mg/dL   Hgb urine dipstick LARGE (A) NEGATIVE   Bilirubin Urine NEGATIVE NEGATIVE   Ketones, ur NEGATIVE NEGATIVE mg/dL    Protein, ur NEGATIVE NEGATIVE mg/dL   Nitrite NEGATIVE NEGATIVE   Leukocytes, UA NEGATIVE NEGATIVE  Urine microscopic-add on     Status: Abnormal   Collection Time: 11/28/15 10:15 AM  Result Value Ref Range   Squamous Epithelial / LPF 0-5 (A) NONE SEEN   WBC, UA 0-5 0 - 5 WBC/hpf   RBC / HPF TOO NUMEROUS TO COUNT 0 - 5 RBC/hpf   Bacteria, UA MANY (A) NONE SEEN   Urine-Other MUCOUS PRESENT   Pregnancy, urine POC     Status: None   Collection Time: 11/28/15 10:37 AM  Result Value Ref Range   Preg Test, Ur NEGATIVE NEGATIVE  CBC     Status: Abnormal   Collection Time: 11/28/15 10:56 AM  Result Value Ref Range   WBC 6.0 4.0 - 10.5 K/uL   RBC 4.26 3.87 - 5.11 MIL/uL   Hemoglobin 10.5 (L) 12.0 - 15.0 g/dL   HCT 96.031.7 (L) 45.436.0 - 09.846.0 %   MCV 74.4 (L) 78.0 - 100.0 fL   MCH 24.6 (L) 26.0 - 34.0 pg   MCHC 33.1 30.0 - 36.0 g/dL   RDW 11.917.0 (H) 14.711.5 - 82.915.5 %   Platelets 326 150 - 400 K/uL    MDM UPT negative Pt declined pain medication Minimal blood on pad in MAU CBC -- anemic but stable compared to CBC in February VSS Pt requested Nexplanon removal today; informed patient that it can't be done in MAU. Pt agreeable to returning for her clinic appt Assessment and Plan  A: 1. Breakthrough bleeding on Nexplanon   2. Anemia due to chronic blood loss     P: Discharge home Rx iron supplement Keep appt on Wednesday for Nexplanon removal Discussed reasons to return to MAU  Judeth HornErin [facility] 11/28/2015, 10:38 AM

## 2015-11-28 NOTE — MAU Note (Signed)
Pt presents to MAU with complaints of heavy vaginal bleeding for two months. Pt states she has had a nexplanon for 3 years. Just moved here and doesn't have a physician, evaluated in ER yesterday

## 2015-11-30 ENCOUNTER — Encounter: Payer: Self-pay | Admitting: General Practice

## 2015-11-30 ENCOUNTER — Ambulatory Visit (INDEPENDENT_AMBULATORY_CARE_PROVIDER_SITE_OTHER): Payer: Self-pay | Admitting: Obstetrics & Gynecology

## 2015-11-30 ENCOUNTER — Encounter: Payer: Self-pay | Admitting: Obstetrics & Gynecology

## 2015-11-30 VITALS — BP 110/63 | HR 80 | Ht 66.0 in | Wt 251.0 lb

## 2015-11-30 DIAGNOSIS — Z308 Encounter for other contraceptive management: Secondary | ICD-10-CM

## 2015-11-30 MED ORDER — ETONOGESTREL-ETHINYL ESTRADIOL 0.12-0.015 MG/24HR VA RING: VAGINAL_RING | VAGINAL | Status: DC

## 2015-11-30 NOTE — Progress Notes (Signed)
   Subjective:    Patient ID: Virginia Singleton, female    DOB: [birth year], 30 y.o.   MRN: [id number]  HPI 30 yo S AA lady here to have her soon to expire Nexplanon removed and for a prescription for Nuvaring (which she has used in the past. She has had a lot of bleeding with the Nexplanon and was told that she is anemic.  Review of Systems     Objective:   Physical Exam Obese WHBFNAD Breathing, conversing, and ambulating normally Consent was signed and time out was done. Her right arm was prepped with betadine after establishing the position of the Nexplanon. The area was infiltrated with 2 cc of 1% lidocaine. A small incision was made and the intact rod was easily removed. A steristrip was placed and her arm was noted to be hemostatic. It was bandaged.  She tolerated the procedure well.     Assessment & Plan:  Contraception- Nuvaring- Rec back up for a month RTC 3 months for annual/pap

## 2016-01-26 ENCOUNTER — Inpatient Hospital Stay (HOSPITAL_COMMUNITY)
Admission: AD | Admit: 2016-01-26 | Discharge: 2016-01-26 | Disposition: A | Discharge status: Home / Self Care | Payer: Medicaid - Out of State | Source: Ambulatory Visit | Attending: Obstetrics & Gynecology | Admitting: Obstetrics & Gynecology

## 2016-01-26 ENCOUNTER — Inpatient Hospital Stay (HOSPITAL_COMMUNITY): Payer: Medicaid - Out of State

## 2016-01-26 ENCOUNTER — Encounter (HOSPITAL_COMMUNITY): Payer: Self-pay | Admitting: *Deleted

## 2016-01-26 DIAGNOSIS — O26891 Other specified pregnancy related conditions, first trimester: Secondary | ICD-10-CM | POA: Insufficient documentation

## 2016-01-26 DIAGNOSIS — R1030 Lower abdominal pain, unspecified: Secondary | ICD-10-CM | POA: Diagnosis present

## 2016-01-26 DIAGNOSIS — Z79899 Other long term (current) drug therapy: Secondary | ICD-10-CM | POA: Insufficient documentation

## 2016-01-26 DIAGNOSIS — B9689 Other specified bacterial agents as the cause of diseases classified elsewhere: Secondary | ICD-10-CM | POA: Diagnosis not present

## 2016-01-26 DIAGNOSIS — E7800 Pure hypercholesterolemia, unspecified: Secondary | ICD-10-CM | POA: Diagnosis not present

## 2016-01-26 DIAGNOSIS — Z3490 Encounter for supervision of normal pregnancy, unspecified, unspecified trimester: Secondary | ICD-10-CM

## 2016-01-26 DIAGNOSIS — Z91018 Allergy to other foods: Secondary | ICD-10-CM | POA: Diagnosis not present

## 2016-01-26 DIAGNOSIS — Z881 Allergy status to other antibiotic agents status: Secondary | ICD-10-CM | POA: Diagnosis not present

## 2016-01-26 DIAGNOSIS — Z3A01 Less than 8 weeks gestation of pregnancy: Secondary | ICD-10-CM | POA: Diagnosis not present

## 2016-01-26 DIAGNOSIS — O3680X0 Pregnancy with inconclusive fetal viability, not applicable or unspecified: Secondary | ICD-10-CM

## 2016-01-26 DIAGNOSIS — A499 Bacterial infection, unspecified: Secondary | ICD-10-CM

## 2016-01-26 DIAGNOSIS — O23591 Infection of other part of genital tract in pregnancy, first trimester: Secondary | ICD-10-CM | POA: Diagnosis not present

## 2016-01-26 DIAGNOSIS — O99282 Endocrine, nutritional and metabolic diseases complicating pregnancy, second trimester: Secondary | ICD-10-CM | POA: Insufficient documentation

## 2016-01-26 DIAGNOSIS — N760 Acute vaginitis: Secondary | ICD-10-CM

## 2016-01-26 LAB — URINALYSIS, ROUTINE W REFLEX MICROSCOPIC
BILIRUBIN URINE: NEGATIVE
GLUCOSE, UA: NEGATIVE mg/dL
Hgb urine dipstick: NEGATIVE
KETONES UR: NEGATIVE mg/dL
LEUKOCYTES UA: NEGATIVE
Nitrite: NEGATIVE
PH: 6 (ref 5.0–8.0)
Protein, ur: NEGATIVE mg/dL
Specific Gravity, Urine: 1.02 (ref 1.005–1.030)

## 2016-01-26 LAB — CBC
HEMATOCRIT: 32.5 % — AB (ref 36.0–46.0)
Hemoglobin: 11 g/dL — ABNORMAL LOW (ref 12.0–15.0)
MCH: 25.9 pg — ABNORMAL LOW (ref 26.0–34.0)
MCHC: 33.8 g/dL (ref 30.0–36.0)
MCV: 76.5 fL — AB (ref 78.0–100.0)
Platelets: 301 10*3/uL (ref 150–400)
RBC: 4.25 MIL/uL (ref 3.87–5.11)
RDW: 18.3 % — AB (ref 11.5–15.5)
WBC: 8.3 10*3/uL (ref 4.0–10.5)

## 2016-01-26 LAB — HCG, QUANTITATIVE, PREGNANCY: hCG, Beta Chain, Quant, S: 4577 m[IU]/mL — ABNORMAL HIGH (ref <5)

## 2016-01-26 LAB — WET PREP, GENITAL
SPERM: NONE SEEN
Trich, Wet Prep: NONE SEEN
Yeast Wet Prep HPF POC: NONE SEEN

## 2016-01-26 LAB — POCT PREGNANCY, URINE: Preg Test, Ur: POSITIVE — AB

## 2016-01-26 MED ORDER — METRONIDAZOLE 500 MG PO TABS: 500.0000 mg | ORAL_TABLET | Freq: Two times a day (BID) | ORAL | 0 refills | Status: DC

## 2016-01-26 MED ORDER — ACETAMINOPHEN 500 MG PO TABS
1000.0000 mg | ORAL_TABLET | Freq: Four times a day (QID) | ORAL | Status: DC | PRN
  Administered 2016-01-26: 1000 mg via ORAL
  Filled 2016-01-26: qty 2

## 2016-01-26 NOTE — MAU Note (Signed)
PT  SAYS SHE STARTED  HAVING  LOWER ABD PAIN AT  630PM  TONIGHT.   BACK HAS BEEN HURTING  ALL DAY- ON/OFF.    HPT-   ON Tuesday -  POSITIVE. LAST SEX-     LAST WEEK.

## 2016-01-26 NOTE — MAU Note (Signed)
SOMETIMES  HAS H/A-  STARTED LAST WEEK- NO HX  OF H/A.      TOOK IBUPROFEN ON  SUN-  3 TABS  -   SOME  RELIEF-  THEN 2 HRS LASTER - H/A   BACK   .      HAS SLIGHT  H/A  NOW.    NAUSEA - NO VOMITING

## 2016-01-26 NOTE — MAU Provider Note (Signed)
History     CSN: [id number]  Arrival date and time: 01/26/16 2025   None     Chief Complaint  Patient presents with  . Abdominal Pain   G1P0 at [redacted]w[redacted]d by LMP c/o lower abdominal pain x2 days. She describes as dull and achy. The pain is intermittent and worsens with movement. She has not tried OTC treatments. She also reports onset of low back pain yesterday. She denies urinary sx. She denies fever, N/V, C/D. She denies VB. She reports thin, white odorous vaginal discharge x3 days. She no new sexual partner and remote hx of CMT. She had +HPT 2 days ago and reports this is unplanned pregnancy with mixed emotions, her partner is "anoyingly happy" and supportive.   OB History    Gravida Para Term Preterm AB Living   1 0 0 0 0 0   SAB TAB Ectopic Multiple Live Births   0 0 0 0        Past Medical History:  Diagnosis Date  . Hypercholesteremia     Past Surgical History:  Procedure Laterality Date  . WISDOM TOOTH EXTRACTION      History reviewed. No pertinent family history.  Social History  Substance Use Topics  . Smoking status: Never Smoker  . Smokeless tobacco: Never Used  . Alcohol use Yes     Comment: occas.    Allergies:  Allergies  Allergen Reactions  . Mushroom Extract Complex Hives and Itching  . Zithromax [Azithromycin] Hives and Rash    Prescriptions Prior to Admission  Medication Sig Dispense Refill Last Dose  . etonogestrel-ethinyl estradiol (NUVARING) 0.12-0.015 MG/24HR vaginal ring Insert vaginally and leave in place for 3 consecutive weeks, then remove for 1 week. 1 each 12   . ferrous sulfate 325 (65 FE) MG tablet Take 1 tablet (325 mg total) by mouth daily. 30 tablet 0 Taking  . ibuprofen (ADVIL,MOTRIN) 200 MG tablet Take 200 mg by mouth every 6 (six) hours as needed for moderate pain.   Taking    Review of Systems  Constitutional: Negative.  Negative for chills and fever.  Gastrointestinal: Positive for abdominal pain. Negative for constipation,  diarrhea, nausea and vomiting.  Genitourinary: Positive for frequency. Negative for dysuria, flank pain and hematuria.   Physical Exam   Blood pressure 121/75, pulse 87, temperature 98.7 F (37.1 C), temperature source Oral, height  (1.676 m), weight 118.4 kg (261 lb), last menstrual period 12/24/2015.  Physical Exam  Constitutional: She is oriented to person, place, and time. She appears well-developed and well-nourished.  HENT:  Head: Normocephalic and atraumatic.  Neck: Normal range of motion. Neck supple.  Cardiovascular: Normal rate.   Respiratory: Effort normal.  GI: Soft. She exhibits no distension and no mass. There is tenderness (at umbilicus and mild LLQ>RLQ). There is no rebound and no guarding.  Genitourinary: Vagina normal and uterus normal.  Genitourinary Comments: External: no lesions Vagina: rugated, nulli, thick curdy white discharge  SVE: closed/long Adnexa: no mass or tenderness bilat, no CMT   Musculoskeletal: Normal range of motion.  Neurological: She is alert and oriented to person, place, and time.  Skin: Skin is warm and dry.  Psychiatric: She has a normal mood and affect.   Results for orders placed or performed during the hospital encounter of 01/26/16 (from the past 24 hour(s))  Urinalysis, Routine w reflex microscopic (not at [facility])     Status: None   Collection Time: 01/26/16  8:43 PM  Result Value Ref Range  Color, Urine YELLOW YELLOW   APPearance CLEAR CLEAR   Specific Gravity, Urine 1.020 1.005 - 1.030   pH 6.0 5.0 - 8.0   Glucose, UA NEGATIVE NEGATIVE mg/dL   Hgb urine dipstick NEGATIVE NEGATIVE   Bilirubin Urine NEGATIVE NEGATIVE   Ketones, ur NEGATIVE NEGATIVE mg/dL   Protein, ur NEGATIVE NEGATIVE mg/dL   Nitrite NEGATIVE NEGATIVE   Leukocytes, UA NEGATIVE NEGATIVE  Pregnancy, urine POC     Status: Abnormal   Collection Time: 01/26/16  8:51 PM  Result Value Ref Range   Preg Test, Ur POSITIVE (A) NEGATIVE  Wet prep, genital      Status: Abnormal   Collection Time: 01/26/16  9:35 PM  Result Value Ref Range   Yeast Wet Prep HPF POC NONE SEEN NONE SEEN   Trich, Wet Prep NONE SEEN NONE SEEN   Clue Cells Wet Prep HPF POC PRESENT (A) NONE SEEN   WBC, Wet Prep HPF POC FEW (A) NONE SEEN   Sperm NONE SEEN   CBC     Status: Abnormal   Collection Time: 01/26/16  9:55 PM  Result Value Ref Range   WBC 8.3 4.0 - 10.5 K/uL   RBC 4.25 3.87 - 5.11 MIL/uL   Hemoglobin 11.0 (L) 12.0 - 15.0 g/dL   HCT 16.132.5 (L) 09.636.0 - 04.546.0 %   MCV 76.5 (L) 78.0 - 100.0 fL   MCH 25.9 (L) 26.0 - 34.0 pg   MCHC 33.8 30.0 - 36.0 g/dL   RDW 40.918.3 (H) 81.111.5 - 91.415.5 %   Platelets 301 150 - 400 K/uL   [place] Ob Comp Less 14 Wks  Result Date: 01/26/2016 CLINICAL DATA:  Lower abdominal pain today. Quantitative beta HCG is pending. LMP was07/01/2016. Gestational age by LMP is4 weeks 5 days. EDC by LMP is04/14/2018. EXAM: OBSTETRIC <14 WK US AND TRANSVAGINAL OB US TECHNIQUE: Both transabdominal and transvaginal ultrasound examinations were performed for complete evaluation of the gestation as well as the maternal uterus, adnexal regions, and pelvic cul-de-sac. Transvaginal technique was performed to assess early pregnancy. COMPARISON:  None. FINDINGS: Intrauterine gestational sac: Present Yolk sac:  Not seen Embryo:  Not seen Cardiac Activity: Not seen MSD: 7.4  mm   5 w   3  d Subchorionic hemorrhage:  None visualized. Maternal uterus/adnexae: Normal appearance of the ovaries. No free pelvic fluid. IMPRESSION: 1. Probable early intrauterine gestational sac, but no yolk sac, fetal pole, or cardiac activity yet visualized. 2. Recommend follow-up quantitative B-HCG levels and follow-up US in 14 days to confirm and assess viability. This recommendation follows SRU consensus guidelines: Diagnostic Criteria for Nonviable Pregnancy Early in the First Trimester. Malva Limes Engl J Med 2013; 782:9562-13; 369:1443-51. Electronically Signed   By: Norva PavlovElizabeth  Brown M.D.   On: 01/26/2016 22:38   [place] Ob  Transvaginal  Result Date: 01/26/2016 CLINICAL DATA:  Lower abdominal pain today. Quantitative beta HCG is pending. LMP was07/01/2016. Gestational age by LMP is4 weeks 5 days. EDC by LMP is04/14/2018. EXAM: OBSTETRIC <14 WK US AND TRANSVAGINAL OB US TECHNIQUE: Both transabdominal and transvaginal ultrasound examinations were performed for complete evaluation of the gestation as well as the maternal uterus, adnexal regions, and pelvic cul-de-sac. Transvaginal technique was performed to assess early pregnancy. COMPARISON:  None. FINDINGS: Intrauterine gestational sac: Present Yolk sac:  Not seen Embryo:  Not seen Cardiac Activity: Not seen MSD: 7.4  mm   5 w   3  d Subchorionic hemorrhage:  None visualized. Maternal uterus/adnexae: Normal appearance of the ovaries. No  free pelvic fluid. IMPRESSION: 1. Probable early intrauterine gestational sac, but no yolk sac, fetal pole, or cardiac activity yet visualized. 2. Recommend follow-up quantitative B-HCG levels and follow-up US in 14 days to confirm and assess viability. This recommendation follows SRU consensus guidelines: Diagnostic Criteria for Nonviable Pregnancy Early in the First Trimester. Malva Limes Med 2013; 161:0960-45. Electronically Signed   By: Norva Pavlov M.D.   On: 01/26/2016 22:38    MAU Course  Procedures Tylenol 1gm po x1  MDM Labs and [place] ordered and reviewed. No evidence acute abdomen or pelvic process. Unlikely ectopic pregnancy. Pain likely physiologic to early pregnancy or BV. Stable for discharge home.   Assessment and Plan   1. Bacterial vaginosis   2. Lower abdominal pain   3. Pregnancy of unknown anatomic location   4. Early stage of pregnancy   5. Abdominal pain, lower    Discharge home Flagyl 500 mg po bid x7 days Tylenol OTC prn Follow up in 2 days for repeat quant in MAU Follow up US in 2 weeks for viability and appt at [facility] routed to pool Return for worsening sx or VB   Donette Larry, CNM 01/26/2016,  9:41 PM

## 2016-01-26 NOTE — Discharge Instructions (Signed)
Bacterial Vaginosis °Bacterial vaginosis is a vaginal infection that occurs when the normal balance of bacteria in the vagina is disrupted. It results from an overgrowth of certain bacteria. This is the most common vaginal infection in women of childbearing age. Treatment is important to prevent complications, especially in pregnant women, as it can cause a premature delivery. °CAUSES  °Bacterial vaginosis is caused by an increase in harmful bacteria that are normally present in smaller amounts in the vagina. Several different kinds of bacteria can cause bacterial vaginosis. However, the reason that the condition develops is not fully understood. °RISK FACTORS °Certain activities or behaviors can put you at an increased risk of developing bacterial vaginosis, including: °· Having a new sex partner or multiple sex partners. °· Douching. °· Using an intrauterine device (IUD) for contraception. °Women do not get bacterial vaginosis from toilet seats, bedding, swimming pools, or contact with objects around them. °SIGNS AND SYMPTOMS  °Some women with bacterial vaginosis have no signs or symptoms. Common symptoms include: °· Grey vaginal discharge. °· A fishlike odor with discharge, especially after sexual intercourse. °· Itching or burning of the vagina and vulva. °· Burning or pain with urination. °DIAGNOSIS  °Your health care provider will take a medical history and examine the vagina for signs of bacterial vaginosis. A sample of vaginal fluid may be taken. Your health care provider will look at this sample under a microscope to check for bacteria and abnormal cells. A vaginal pH test may also be done.  °TREATMENT  °Bacterial vaginosis may be treated with antibiotic medicines. These may be given in the form of a pill or a vaginal cream. A second round of antibiotics may be prescribed if the condition comes back after treatment. Because bacterial vaginosis increases your risk for sexually transmitted diseases, getting  treated can help reduce your risk for chlamydia, gonorrhea, HIV, and herpes. °HOME CARE INSTRUCTIONS  °· Only take over-the-counter or prescription medicines as directed by your health care provider. °· If antibiotic medicine was prescribed, take it as directed. Make sure you finish it even if you start to feel better. °· Tell all sexual partners that you have a vaginal infection. They should see their health care provider and be treated if they have problems, such as a mild rash or itching. °· During treatment, it is important that you follow these instructions: °· Avoid sexual activity or use condoms correctly. °· Do not douche. °· Avoid alcohol as directed by your health care provider. °· Avoid breastfeeding as directed by your health care provider. °SEEK MEDICAL CARE IF:  °· Your symptoms are not improving after 3 days of treatment. °· You have increased discharge or pain. °· You have a fever. °MAKE SURE YOU:  °· Understand these instructions. °· Will watch your condition. °· Will get help right away if you are not doing well or get worse. °FOR MORE INFORMATION  °Centers for Disease Control and Prevention, Division of STD Prevention: www.cdc.gov/std °American Sexual Health Association (ASHA): www.ashastd.org  °  °This information is not intended to replace advice given to you by your health care provider. Make sure you discuss any questions you have with your health care provider. °  °Document Released: 06/04/2005 Document Revised: 06/25/2014 Document Reviewed: 01/14/2013 °Elsevier Interactive Patient Education ©2016 [employer]vier Inc[employer] °Abdominal Pain During Pregnancy °Abdominal pain is common in pregnancy. Most of the time, it does not cause harm. There are many causes of abdominal pain. Some causes are more serious than others. Some of the causes   of abdominal pain in pregnancy are easily diagnosed. Occasionally, the diagnosis takes time to understand. Other times, the cause is not determined. Abdominal pain can be  a sign that something is very wrong with the pregnancy, or the pain may have nothing to do with the pregnancy at all. For this reason, always tell your health care provider if you have any abdominal discomfort. °HOME CARE INSTRUCTIONS  °Monitor your abdominal pain for any changes. The following actions may help to alleviate any discomfort you are experiencing: °· Do not have sexual intercourse or put anything in your vagina until your symptoms go away completely. °· Get plenty of rest until your pain improves. °· Drink clear fluids if you feel nauseous. Avoid solid food as long as you are uncomfortable or nauseous. °· Only take over-the-counter or prescription medicine as directed by your health care provider. °· Keep all follow-up appointments with your health care provider. °SEEK IMMEDIATE MEDICAL CARE IF: °· You are bleeding, leaking fluid, or passing tissue from the vagina. °· You have increasing pain or cramping. °· You have persistent vomiting. °· You have painful or bloody urination. °· You have a fever. °· You notice a decrease in your baby's movements. °· You have extreme weakness or feel faint. °· You have shortness of breath, with or without abdominal pain. °· You develop a severe headache with abdominal pain. °· You have abnormal vaginal discharge with abdominal pain. °· You have persistent diarrhea. °· You have abdominal pain that continues even after rest, or gets worse. °MAKE SURE YOU:  °· Understand these instructions. °· Will watch your condition. °· Will get help right away if you are not doing well or get worse. °  °This information is not intended to replace advice given to you by your health care provider. Make sure you discuss any questions you have with your health care provider. °  °Document Released: 06/04/2005 Document Revised: 03/25/2013 Document Reviewed: 01/01/2013 °Elsevier Interactive Patient Education ©2016 Elsevier Inc. ° °

## 2016-01-27 ENCOUNTER — Telehealth: Payer: Self-pay | Admitting: Certified Nurse Midwife

## 2016-01-27 LAB — GC/CHLAMYDIA PROBE AMP (~~LOC~~) NOT AT ARMC
Chlamydia: NEGATIVE
Neisseria Gonorrhea: NEGATIVE

## 2016-01-27 NOTE — Telephone Encounter (Signed)
Pt notified to return to [facility] tomorrow evening for rpt quant HCG. Pt agrees and verbalizes understanding.

## 2016-01-28 ENCOUNTER — Inpatient Hospital Stay (HOSPITAL_COMMUNITY)
Admission: AD | Admit: 2016-01-28 | Discharge: 2016-01-28 | Disposition: A | Discharge status: Home / Self Care | Payer: Medicaid - Out of State | Source: Ambulatory Visit | Attending: Obstetrics & Gynecology | Admitting: Obstetrics & Gynecology

## 2016-01-28 DIAGNOSIS — O26891 Other specified pregnancy related conditions, first trimester: Secondary | ICD-10-CM | POA: Diagnosis not present

## 2016-01-28 DIAGNOSIS — R109 Unspecified abdominal pain: Secondary | ICD-10-CM

## 2016-01-28 DIAGNOSIS — O9989 Other specified diseases and conditions complicating pregnancy, childbirth and the puerperium: Secondary | ICD-10-CM

## 2016-01-28 DIAGNOSIS — Z3A01 Less than 8 weeks gestation of pregnancy: Secondary | ICD-10-CM | POA: Insufficient documentation

## 2016-01-28 DIAGNOSIS — O26899 Other specified pregnancy related conditions, unspecified trimester: Secondary | ICD-10-CM

## 2016-01-28 LAB — HCG, QUANTITATIVE, PREGNANCY: HCG, BETA CHAIN, QUANT, S: 8263 m[IU]/mL — AB (ref <5)

## 2016-01-28 NOTE — Discharge Instructions (Signed)
First Trimester of Pregnancy The first trimester of pregnancy is from week 1 until the end of week 12 (months 1 through 3). A week after a sperm fertilizes an egg, the egg will implant on the wall of the uterus. This embryo will begin to develop into a baby. Genes from you and your partner are forming the baby. The female genes determine whether the baby is a boy or a girl. At 6-8 weeks, the eyes and face are formed, and the heartbeat can be seen on ultrasound. At the end of 12 weeks, all the baby's organs are formed.  Now that you are pregnant, you will want to do everything you can to have a healthy baby. Two of the most important things are to get good prenatal care and to follow your health care provider's instructions. Prenatal care is all the medical care you receive before the baby's birth. This care will help prevent, find, and treat any problems during the pregnancy and childbirth. BODY CHANGES Your body goes through many changes during pregnancy. The changes vary from woman to woman.   You may gain or lose a couple of pounds at first.  You may feel sick to your stomach (nauseous) and throw up (vomit). If the vomiting is uncontrollable, call your health care provider.  You may tire easily.  You may develop headaches that can be relieved by medicines approved by your health care provider.  You may urinate more often. Painful urination may mean you have a bladder infection.  You may develop heartburn as a result of your pregnancy.  You may develop constipation because certain hormones are causing the muscles that push waste through your intestines to slow down.  You may develop hemorrhoids or swollen, bulging veins (varicose veins).  Your breasts may begin to grow larger and become tender. Your nipples may stick out more, and the tissue that surrounds them (areola) may become darker.  Your gums may bleed and may be sensitive to brushing and flossing.  Dark spots or blotches (chloasma,  mask of pregnancy) may develop on your face. This will likely fade after the baby is born.  Your menstrual periods will stop.  You may have a loss of appetite.  You may develop cravings for certain kinds of food.  You may have changes in your emotions from day to day, such as being excited to be pregnant or being concerned that something may go wrong with the pregnancy and baby.  You may have more vivid and strange dreams.  You may have changes in your hair. These can include thickening of your hair, rapid growth, and changes in texture. Some women also have hair loss during or after pregnancy, or hair that feels dry or thin. Your hair will most likely return to normal after your baby is born. WHAT TO EXPECT AT YOUR PRENATAL VISITS During a routine prenatal visit:  You will be weighed to make sure you and the baby are growing normally.  Your blood pressure will be taken.  Your abdomen will be measured to track your baby's growth.  The fetal heartbeat will be listened to starting around week 10 or 12 of your pregnancy.  Test results from any previous visits will be discussed. Your health care provider may ask you:  How you are feeling.  If you are feeling the baby move.  If you have had any abnormal symptoms, such as leaking fluid, bleeding, severe headaches, or abdominal cramping.  If you are using any tobacco products,   including cigarettes, chewing tobacco, and electronic cigarettes.  If you have any questions. Other tests that may be performed during your first trimester include:  Blood tests to find your blood type and to check for the presence of any previous infections. They will also be used to check for low iron levels (anemia) and Rh antibodies. Later in the pregnancy, blood tests for diabetes will be done along with other tests if problems develop.  Urine tests to check for infections, diabetes, or protein in the urine.  An ultrasound to confirm the proper growth  and development of the baby.  An amniocentesis to check for possible genetic problems.  Fetal screens for spina bifida and Down syndrome.  You may need other tests to make sure you and the baby are doing well.  HIV (human immunodeficiency virus) testing. Routine prenatal testing includes screening for HIV, unless you choose not to have this test. HOME CARE INSTRUCTIONS  Medicines  Follow your health care provider's instructions regarding medicine use. Specific medicines may be either safe or unsafe to take during pregnancy.  Take your prenatal vitamins as directed.  If you develop constipation, try taking a stool softener if your health care provider approves. Diet  Eat regular, well-balanced meals. Choose a variety of foods, such as meat or vegetable-based protein, fish, milk and low-fat dairy products, vegetables, fruits, and whole grain breads and cereals. Your health care provider will help you determine the amount of weight gain that is right for you.  Avoid raw meat and uncooked cheese. These carry germs that can cause birth defects in the baby.  Eating four or five small meals rather than three large meals a day may help relieve nausea and vomiting. If you start to feel nauseous, eating a few soda crackers can be helpful. Drinking liquids between meals instead of during meals also seems to help nausea and vomiting.  If you develop constipation, eat more high-fiber foods, such as fresh vegetables or fruit and whole grains. Drink enough fluids to keep your urine clear or pale yellow. Activity and Exercise  Exercise only as directed by your health care provider. Exercising will help you:  Control your weight.  Stay in shape.  Be prepared for labor and delivery.  Experiencing pain or cramping in the lower abdomen or low back is a good sign that you should stop exercising. Check with your health care provider before continuing normal exercises.  Try to avoid standing for long  periods of time. Move your legs often if you must stand in one place for a long time.  Avoid heavy lifting.  Wear low-heeled shoes, and practice good posture.  You may continue to have sex unless your health care provider directs you otherwise. Relief of Pain or Discomfort  Wear a good support bra for breast tenderness.   Take warm sitz baths to soothe any pain or discomfort caused by hemorrhoids. Use hemorrhoid cream if your health care provider approves.   Rest with your legs elevated if you have leg cramps or low back pain.  If you develop varicose veins in your legs, wear support hose. Elevate your feet for 15 minutes, 3-4 times a day. Limit salt in your diet. Prenatal Care  Schedule your prenatal visits by the twelfth week of pregnancy. They are usually scheduled monthly at first, then more often in the last 2 months before delivery.  Write down your questions. Take them to your prenatal visits.  Keep all your prenatal visits as directed by your   health care provider. Safety  Wear your seat belt at all times when driving.  Make a list of emergency phone numbers, including numbers for family, friends, the hospital, and police and fire departments. General Tips  Ask your health care provider for a referral to a local prenatal education class. Begin classes no later than at the beginning of month 6 of your pregnancy.  Ask for help if you have counseling or nutritional needs during pregnancy. Your health care provider can offer advice or refer you to specialists for help with various needs.  Do not use hot tubs, steam rooms, or saunas.  Do not douche or use tampons or scented sanitary pads.  Do not cross your legs for long periods of time.  Avoid cat litter boxes and soil used by cats. These carry germs that can cause birth defects in the baby and possibly loss of the fetus by miscarriage or stillbirth.  Avoid all smoking, herbs, alcohol, and medicines not prescribed by  your health care provider. Chemicals in these affect the formation and growth of the baby.  Do not use any tobacco products, including cigarettes, chewing tobacco, and electronic cigarettes. If you need help quitting, ask your health care provider. You may receive counseling support and other resources to help you quit.  Schedule a dentist appointment. At home, brush your teeth with a soft toothbrush and be gentle when you floss. SEEK MEDICAL CARE IF:   You have dizziness.  You have mild pelvic cramps, pelvic pressure, or nagging pain in the abdominal area.  You have persistent nausea, vomiting, or diarrhea.  You have a bad smelling vaginal discharge.  You have pain with urination.  You notice increased swelling in your face, hands, legs, or ankles. SEEK IMMEDIATE MEDICAL CARE IF:   You have a fever.  You are leaking fluid from your vagina.  You have spotting or bleeding from your vagina.  You have severe abdominal cramping or pain.  You have rapid weight gain or loss.  You vomit blood or material that looks like coffee grounds.  You are exposed to German measles and have never had them.  You are exposed to fifth disease or chickenpox.  You develop a severe headache.  You have shortness of breath.  You have any kind of trauma, such as from a fall or a car accident.   This information is not intended to replace advice given to you by your health care provider. Make sure you discuss any questions you have with your health care provider.   Document Released: 05/29/2001 Document Revised: 06/25/2014 Document Reviewed: 04/14/2013 Elsevier Interactive Patient Education 2016 Elsevier Inc.  

## 2016-01-28 NOTE — [facility] Provider Note (Signed)
History   Chief Complaint:  Follow-up   Virginia Singleton is  30 y.o. G1P0000 Patient's last menstrual period was 12/24/2015.Marland Kitchen. Patient is here for follow up of quantitative HCG and ongoing surveillance of pregnancy status.   She is 4616w0d weeks gestation  by LMP.    Since her last visit, the patient is without new complaint.   The patient reports bleeding as  none now.    General ROS:  negative  Her previous Quantitative HCG values are:   Ref. Range 01/26/2016 21:55  HCG, Beta Chain, Quant, S Latest Ref Range: <5 mIU/mL 4,577 (H)       Physical Exam   Blood pressure 127/82, pulse 84, temperature 98.9 F (37.2 C), resp. rate 18, height 5\' 6"  (1.676 m), weight 260 lb (117.9 kg), last menstrual period 12/24/2015.  Focused Gynecological Exam: examination not indicated  Labs: Results for orders placed or performed during the hospital encounter of 01/28/16 (from the past 24 hour(s))  hCG, quantitative, pregnancy   Collection Time: 01/28/16  7:52 PM  Result Value Ref Range   hCG, Beta Chain, Quant, S 8,263 (H) <5 mIU/mL    Ultrasound Studies:   [place] Ob Comp Less 14 Wks  Result Date: 01/26/2016 CLINICAL DATA:  Lower abdominal pain today. Quantitative beta HCG is pending. LMP was07/01/2016. Gestational age by LMP is4 weeks 5 days. EDC by LMP is04/14/2018. EXAM: OBSTETRIC <14 WK US AND TRANSVAGINAL OB US TECHNIQUE: Both transabdominal and transvaginal ultrasound examinations were performed for complete evaluation of the gestation as well as the maternal uterus, adnexal regions, and pelvic cul-de-sac. Transvaginal technique was performed to assess early pregnancy. COMPARISON:  None. FINDINGS: Intrauterine gestational sac: Present Yolk sac:  Not seen Embryo:  Not seen Cardiac Activity: Not seen MSD: 7.4  mm   5 w   3  d Subchorionic hemorrhage:  None visualized. Maternal uterus/adnexae: Normal appearance of the ovaries. No free pelvic fluid. IMPRESSION: 1. Probable early intrauterine  gestational sac, but no yolk sac, fetal pole, or cardiac activity yet visualized. 2. Recommend follow-up quantitative B-HCG levels and follow-up US in 14 days to confirm and assess viability. This recommendation follows SRU consensus guidelines: Diagnostic Criteria for Nonviable Pregnancy Early in the First Trimester. Malva Limes Engl J Med 2013; [id number]; 369:1443-51. Electronically Signed   By: Norva PavlovElizabeth  Brown M.D.   On: 01/26/2016 22:38   [place] Ob Transvaginal  Result Date: 01/26/2016 CLINICAL DATA:  Lower abdominal pain today. Quantitative beta HCG is pending. LMP was07/01/2016. Gestational age by LMP is4 weeks 5 days. EDC by LMP is04/14/2018. EXAM: OBSTETRIC <14 WK US AND TRANSVAGINAL OB US TECHNIQUE: Both transabdominal and transvaginal ultrasound examinations were performed for complete evaluation of the gestation as well as the maternal uterus, adnexal regions, and pelvic cul-de-sac. Transvaginal technique was performed to assess early pregnancy. COMPARISON:  None. FINDINGS: Intrauterine gestational sac: Present Yolk sac:  Not seen Embryo:  Not seen Cardiac Activity: Not seen MSD: 7.4  mm   5 w   3  d Subchorionic hemorrhage:  None visualized. Maternal uterus/adnexae: Normal appearance of the ovaries. No free pelvic fluid. IMPRESSION: 1. Probable early intrauterine gestational sac, but no yolk sac, fetal pole, or cardiac activity yet visualized. 2. Recommend follow-up quantitative B-HCG levels and follow-up US in 14 days to confirm and assess viability. This recommendation follows SRU consensus guidelines: Diagnostic Criteria for Nonviable Pregnancy Early in the First Trimester. Malva Limes Engl J Med 2013; [id number]; [id number]. Electronically Signed   By: Norva PavlovElizabeth  Brown M.D.   On: 01/26/2016  22:38    Assessment:  [redacted]w[redacted]d weeks gestation   Appropriate doubling of HCG levels   Plan: The patient is instructed to follow up in in 1 weeks for ultrasound Message sent to clinic for appt following [place] next week  [facility] 01/28/2016,  10:00 PM

## 2016-01-28 NOTE — [facility] Note (Signed)
Here for repeat BHCG. Denies any problems.

## 2016-02-07 ENCOUNTER — Ambulatory Visit: Payer: Medicaid - Out of State | Admitting: General Practice

## 2016-02-07 ENCOUNTER — Ambulatory Visit (HOSPITAL_COMMUNITY)
Admission: RE | Admit: 2016-02-07 | Discharge: 2016-02-07 | Disposition: A | Discharge status: Home / Self Care | Payer: Medicaid - Out of State | Source: Ambulatory Visit | Attending: Advanced Practice Midwife | Admitting: Advanced Practice Midwife

## 2016-02-07 ENCOUNTER — Encounter: Payer: Self-pay | Admitting: General Practice

## 2016-02-07 DIAGNOSIS — Z3490 Encounter for supervision of normal pregnancy, unspecified, unspecified trimester: Secondary | ICD-10-CM

## 2016-02-07 DIAGNOSIS — R109 Unspecified abdominal pain: Secondary | ICD-10-CM

## 2016-02-07 DIAGNOSIS — B9689 Other specified bacterial agents as the cause of diseases classified elsewhere: Secondary | ICD-10-CM

## 2016-02-07 DIAGNOSIS — O26899 Other specified pregnancy related conditions, unspecified trimester: Secondary | ICD-10-CM

## 2016-02-07 DIAGNOSIS — O3680X0 Pregnancy with inconclusive fetal viability, not applicable or unspecified: Secondary | ICD-10-CM

## 2016-02-07 DIAGNOSIS — Z3A01 Less than 8 weeks gestation of pregnancy: Secondary | ICD-10-CM | POA: Insufficient documentation

## 2016-02-07 DIAGNOSIS — A499 Bacterial infection, unspecified: Secondary | ICD-10-CM | POA: Diagnosis not present

## 2016-02-07 DIAGNOSIS — N760 Acute vaginitis: Secondary | ICD-10-CM | POA: Diagnosis not present

## 2016-02-07 DIAGNOSIS — O98811 Other maternal infectious and parasitic diseases complicating pregnancy, first trimester: Secondary | ICD-10-CM | POA: Insufficient documentation

## 2016-02-07 DIAGNOSIS — Z331 Pregnant state, incidental: Secondary | ICD-10-CM | POA: Diagnosis present

## 2016-02-07 DIAGNOSIS — R1030 Lower abdominal pain, unspecified: Secondary | ICD-10-CM

## 2016-02-07 DIAGNOSIS — O9989 Other specified diseases and conditions complicating pregnancy, childbirth and the puerperium: Secondary | ICD-10-CM | POA: Diagnosis not present

## 2016-02-07 NOTE — Progress Notes (Signed)
Patient here today for viability ultrasound results. Per Sharen CounterLisa Leftwich-Kirby, ultrasound shows normal progressing pregnancy with due date of 09/25/16. Patient should begin PNV & start care. Informed patient of results, dating, & provided ultrasound pictures. Reviewed medications with patient, provided pregnancy verification letter & encouraged her to start care. Patient verbalized understanding to all & had no questions

## 2016-02-23 ENCOUNTER — Inpatient Hospital Stay (HOSPITAL_COMMUNITY)
Admission: AD | Admit: 2016-02-23 | Discharge: 2016-02-24 | Disposition: A | Discharge status: Home / Self Care | Payer: Medicaid Other | Source: Ambulatory Visit | Attending: Obstetrics & Gynecology | Admitting: Obstetrics & Gynecology

## 2016-02-23 ENCOUNTER — Encounter (HOSPITAL_COMMUNITY): Payer: Self-pay

## 2016-02-23 DIAGNOSIS — Z79899 Other long term (current) drug therapy: Secondary | ICD-10-CM | POA: Insufficient documentation

## 2016-02-23 DIAGNOSIS — O26891 Other specified pregnancy related conditions, first trimester: Secondary | ICD-10-CM | POA: Diagnosis not present

## 2016-02-23 DIAGNOSIS — R102 Pelvic and perineal pain: Secondary | ICD-10-CM | POA: Diagnosis not present

## 2016-02-23 DIAGNOSIS — Z888 Allergy status to other drugs, medicaments and biological substances status: Secondary | ICD-10-CM | POA: Insufficient documentation

## 2016-02-23 DIAGNOSIS — Z3A08 8 weeks gestation of pregnancy: Secondary | ICD-10-CM | POA: Diagnosis present

## 2016-02-23 DIAGNOSIS — O219 Vomiting of pregnancy, unspecified: Secondary | ICD-10-CM | POA: Diagnosis not present

## 2016-02-23 DIAGNOSIS — N898 Other specified noninflammatory disorders of vagina: Secondary | ICD-10-CM | POA: Insufficient documentation

## 2016-02-23 LAB — WET PREP, GENITAL
CLUE CELLS WET PREP: NONE SEEN
SPERM: NONE SEEN
Trich, Wet Prep: NONE SEEN
Yeast Wet Prep HPF POC: NONE SEEN

## 2016-02-23 LAB — URINALYSIS, ROUTINE W REFLEX MICROSCOPIC
BILIRUBIN URINE: NEGATIVE
GLUCOSE, UA: NEGATIVE mg/dL
Hgb urine dipstick: NEGATIVE
KETONES UR: 15 mg/dL — AB
Leukocytes, UA: NEGATIVE
Nitrite: NEGATIVE
PH: 5.5 (ref 5.0–8.0)
Protein, ur: 30 mg/dL — AB

## 2016-02-23 LAB — URINE MICROSCOPIC-ADD ON

## 2016-02-23 NOTE — [facility] Note (Signed)
Pt c/o lower/mid abdominal pain for several days. Has not taken anything for it. Denies vag bleeding. States she has some discharge with an odor.

## 2016-02-23 NOTE — MAU Provider Note (Signed)
History     CSN: [id number]  Arrival date and time: 02/23/16 2308   First Provider Initiated Contact with Patient 02/23/16 2330      Chief Complaint  Patient presents with  . Dysuria  . Emesis  . Pelvic Pain   Virginia Singleton is 30 y.o. G1P0000 at 4445w5d who presents today with pain with urination, abdominal pain and vaginal discharge. She reports that the discharge has odor. She denies itching or irritation. She is also having nausea and vomiting, and does not have any medications for that at this time.    Abdominal Pain  This is a new problem. The current episode started yesterday. The onset quality is gradual. The problem occurs constantly. The pain is located in the generalized abdominal region. The pain is at a severity of 6/10. The quality of the pain is cramping and a sensation of fullness. The abdominal pain does not radiate. Associated symptoms include constipation, dysuria, frequency, nausea and vomiting. Pertinent negatives include no diarrhea or fever. Nothing aggravates the pain. The pain is relieved by nothing. She has tried nothing for the symptoms.  Emesis   This is a new problem. The current episode started in the past 7 days. The problem occurs 2 to 4 times per day. The problem has been unchanged. The emesis has an appearance of stomach contents. There has been no fever. Associated symptoms include abdominal pain. Pertinent negatives include no chills, diarrhea or fever. Risk factors: pregnancy  She has tried nothing for the symptoms.     Past Medical History:  Diagnosis Date  . Hypercholesteremia     Past Surgical History:  Procedure Laterality Date  . WISDOM TOOTH EXTRACTION      No family history on file.  Social History  Substance Use Topics  . Smoking status: Never Smoker  . Smokeless tobacco: Never Used  . Alcohol use Yes     Comment: occas.    Allergies:  Allergies  Allergen Reactions  . Mushroom Extract Complex Hives and Itching  .  Zithromax [Azithromycin] Hives and Rash    Prescriptions Prior to Admission  Medication Sig Dispense Refill Last Dose  . Prenatal Vit-Fe Fumarate-FA (PRENATAL MULTIVITAMIN) TABS tablet Take 1 tablet by mouth daily at 12 noon.   Taking    Review of Systems  Constitutional: Negative for chills and fever.  Gastrointestinal: Positive for abdominal pain, constipation, nausea and vomiting. Negative for diarrhea.  Genitourinary: Positive for dysuria, frequency and urgency.   Physical Exam   Height 5\' 6"  (1.676 m), weight 252 lb (114.3 kg), last menstrual period 12/24/2015.  Physical Exam  Nursing note and vitals reviewed. Constitutional: She is oriented to person, place, and time. She appears well-developed and well-nourished. No distress.  HENT:  Head: Normocephalic.  Cardiovascular: Normal rate.   Respiratory: Effort normal.  GI: Soft. There is no tenderness. There is no rebound.  Neurological: She is alert and oriented to person, place, and time.  Skin: Skin is warm and dry.  Psychiatric: She has a normal mood and affect.   Bedside US: +cardiac activity.   Results for orders placed or performed during the hospital encounter of 02/23/16 (from the past 24 hour(s))  Urinalysis, Routine w reflex microscopic (not at [facility])     Status: Abnormal   Collection Time: 02/23/16 11:20 PM  Result Value Ref Range   Color, Urine YELLOW YELLOW   APPearance CLEAR CLEAR   Specific Gravity, Urine >1.030 (H) 1.005 - 1.030   pH 5.5 5.0 - 8.0  Glucose, UA NEGATIVE NEGATIVE mg/dL   Hgb urine dipstick NEGATIVE NEGATIVE   Bilirubin Urine NEGATIVE NEGATIVE   Ketones, ur 15 (A) NEGATIVE mg/dL   Protein, ur 30 (A) NEGATIVE mg/dL   Nitrite NEGATIVE NEGATIVE   Leukocytes, UA NEGATIVE NEGATIVE  Urine microscopic-add on     Status: Abnormal   Collection Time: 02/23/16 11:20 PM  Result Value Ref Range   Squamous Epithelial / LPF 6-30 (A) NONE SEEN   WBC, UA 0-5 0 - 5 WBC/hpf   RBC / HPF 0-5 0 - 5  RBC/hpf   Bacteria, UA RARE (A) NONE SEEN   Urine-Other MUCOUS PRESENT   Wet prep, genital     Status: Abnormal   Collection Time: 02/23/16 11:40 PM  Result Value Ref Range   Yeast Wet Prep HPF POC NONE SEEN NONE SEEN   Trich, Wet Prep NONE SEEN NONE SEEN   Clue Cells Wet Prep HPF POC NONE SEEN NONE SEEN   WBC, Wet Prep HPF POC FEW (A) NONE SEEN   Sperm NONE SEEN     MAU Course  Procedures  MDM   Assessment and Plan   1. Nausea/vomiting in pregnancy    DC home Comfort measures reviewed  UC, GC/CT pending  RX: phenergan PRN #30 with 1RF  Return to MAU as needed FU with OB as planned  Follow-up Information    [facility] .   Contact information: [address]  [address] [place] [address] [phone]            Tawnya Crook 02/23/2016, 11:31 PM

## 2016-02-24 DIAGNOSIS — O219 Vomiting of pregnancy, unspecified: Secondary | ICD-10-CM

## 2016-02-24 LAB — GC/CHLAMYDIA PROBE AMP (~~LOC~~) NOT AT ARMC
Chlamydia: NEGATIVE
Neisseria Gonorrhea: NEGATIVE

## 2016-02-24 MED ORDER — PROMETHAZINE HCL 25 MG PO TABS: 12.5000 mg | ORAL_TABLET | Freq: Four times a day (QID) | ORAL | 1 refills | Status: DC | PRN

## 2016-02-24 MED ORDER — PROMETHAZINE HCL 25 MG PO TABS
25.0000 mg | ORAL_TABLET | Freq: Once | ORAL | Status: AC
  Administered 2016-02-24: 25 mg via ORAL
  Filled 2016-02-24: qty 1

## 2016-02-24 NOTE — Discharge Instructions (Signed)

## 2016-02-25 LAB — URINE CULTURE

## 2016-05-09 ENCOUNTER — Other Ambulatory Visit (HOSPITAL_COMMUNITY): Payer: Self-pay | Admitting: Obstetrics and Gynecology

## 2016-05-09 DIAGNOSIS — Z3689 Encounter for other specified antenatal screening: Secondary | ICD-10-CM

## 2016-05-09 DIAGNOSIS — Z3A21 21 weeks gestation of pregnancy: Secondary | ICD-10-CM

## 2016-05-21 ENCOUNTER — Other Ambulatory Visit (HOSPITAL_COMMUNITY): Payer: Self-pay | Admitting: Obstetrics and Gynecology

## 2016-05-21 ENCOUNTER — Ambulatory Visit (HOSPITAL_COMMUNITY)
Admission: RE | Admit: 2016-05-21 | Discharge: 2016-05-21 | Disposition: A | Discharge status: Home / Self Care | Payer: Medicaid Other | Source: Ambulatory Visit | Attending: Obstetrics and Gynecology | Admitting: Obstetrics and Gynecology

## 2016-05-21 DIAGNOSIS — O99212 Obesity complicating pregnancy, second trimester: Secondary | ICD-10-CM

## 2016-05-21 DIAGNOSIS — Z3689 Encounter for other specified antenatal screening: Secondary | ICD-10-CM

## 2016-05-21 DIAGNOSIS — Z3A21 21 weeks gestation of pregnancy: Secondary | ICD-10-CM

## 2016-05-24 ENCOUNTER — Encounter (HOSPITAL_COMMUNITY): Payer: Self-pay

## 2016-07-10 ENCOUNTER — Encounter (HOSPITAL_COMMUNITY): Payer: Self-pay | Admitting: *Deleted

## 2016-07-10 ENCOUNTER — Inpatient Hospital Stay (HOSPITAL_COMMUNITY)
Admission: AD | Admit: 2016-07-10 | Discharge: 2016-07-18 | DRG: 766 | Disposition: A | Discharge status: Home / Self Care | Payer: Medicaid Other | Source: Ambulatory Visit | Attending: Obstetrics and Gynecology | Admitting: Obstetrics and Gynecology

## 2016-07-10 DIAGNOSIS — O1493 Unspecified pre-eclampsia, third trimester: Secondary | ICD-10-CM

## 2016-07-10 DIAGNOSIS — Z3A28 28 weeks gestation of pregnancy: Secondary | ICD-10-CM | POA: Diagnosis not present

## 2016-07-10 DIAGNOSIS — O139 Gestational [pregnancy-induced] hypertension without significant proteinuria, unspecified trimester: Secondary | ICD-10-CM | POA: Diagnosis present

## 2016-07-10 DIAGNOSIS — O1413 Severe pre-eclampsia, third trimester: Secondary | ICD-10-CM

## 2016-07-10 DIAGNOSIS — O4593 Premature separation of placenta, unspecified, third trimester: Secondary | ICD-10-CM | POA: Diagnosis present

## 2016-07-10 DIAGNOSIS — O1414 Severe pre-eclampsia complicating childbirth: Principal | ICD-10-CM | POA: Diagnosis present

## 2016-07-10 DIAGNOSIS — O1490 Unspecified pre-eclampsia, unspecified trimester: Secondary | ICD-10-CM

## 2016-07-10 HISTORY — DX: Migraine, unspecified, not intractable, without status migrainosus: G43.909

## 2016-07-10 LAB — CBC
HEMATOCRIT: 36.6 % (ref 36.0–46.0)
Hemoglobin: 13.3 g/dL (ref 12.0–15.0)
MCH: 30.1 pg (ref 26.0–34.0)
MCHC: 36.3 g/dL — AB (ref 30.0–36.0)
MCV: 82.8 fL (ref 78.0–100.0)
Platelets: 206 10*3/uL (ref 150–400)
RBC: 4.42 MIL/uL (ref 3.87–5.11)
RDW: 15.1 % (ref 11.5–15.5)
WBC: 5.9 10*3/uL (ref 4.0–10.5)

## 2016-07-10 LAB — URINALYSIS, ROUTINE W REFLEX MICROSCOPIC
Bilirubin Urine: NEGATIVE
GLUCOSE, UA: NEGATIVE mg/dL
HGB URINE DIPSTICK: NEGATIVE
KETONES UR: NEGATIVE mg/dL
Leukocytes, UA: NEGATIVE
NITRITE: NEGATIVE
PH: 5 (ref 5.0–8.0)
Specific Gravity, Urine: 1.024 (ref 1.005–1.030)

## 2016-07-10 LAB — COMPREHENSIVE METABOLIC PANEL
ALK PHOS: 72 U/L (ref 38–126)
ALT: 19 U/L (ref 14–54)
ANION GAP: 6 (ref 5–15)
AST: 23 U/L (ref 15–41)
Albumin: 2.9 g/dL — ABNORMAL LOW (ref 3.5–5.0)
BILIRUBIN TOTAL: 0.5 mg/dL (ref 0.3–1.2)
BUN: 13 mg/dL (ref 6–20)
CALCIUM: 8.4 mg/dL — AB (ref 8.9–10.3)
CO2: 21 mmol/L — ABNORMAL LOW (ref 22–32)
Chloride: 108 mmol/L (ref 101–111)
Creatinine, Ser: 0.9 mg/dL (ref 0.44–1.00)
GFR calc non Af Amer: >60 mL/min (ref >60)
Glucose, Bld: 81 mg/dL (ref 65–99)
POTASSIUM: 4.4 mmol/L (ref 3.5–5.1)
SODIUM: 135 mmol/L (ref 135–145)
TOTAL PROTEIN: 6 g/dL — AB (ref 6.5–8.1)

## 2016-07-10 LAB — PROTEIN / CREATININE RATIO, URINE
Creatinine, Urine: 489 mg/dL
Protein Creatinine Ratio: 2.72 mg/mg{Cre} — ABNORMAL HIGH (ref 0.00–0.15)
TOTAL PROTEIN, URINE: 1329 mg/dL

## 2016-07-10 LAB — TYPE AND SCREEN
ABO/RH(D): A POS
ANTIBODY SCREEN: NEGATIVE

## 2016-07-10 LAB — ABO/RH: ABO/RH(D): A POS

## 2016-07-10 MED ORDER — BETAMETHASONE SOD PHOS & ACET 6 (3-3) MG/ML IJ SUSP
12.0000 mg | INTRAMUSCULAR | Status: AC
  Administered 2016-07-10 – 2016-07-11 (×2): 12 mg via INTRAMUSCULAR
  Filled 2016-07-10 (×2): qty 2

## 2016-07-10 MED ORDER — NIFEDIPINE 10 MG PO CAPS: 20.0000 mg | ORAL_CAPSULE | Freq: Once | ORAL | Status: DC

## 2016-07-10 MED ORDER — BUTALBITAL-APAP-CAFFEINE 50-325-40 MG PO TABS
2.0000 | ORAL_TABLET | Freq: Once | ORAL | Status: AC
  Administered 2016-07-10: 2 via ORAL
  Filled 2016-07-10: qty 2

## 2016-07-10 MED ORDER — PRENATAL MULTIVITAMIN CH
1.0000 | ORAL_TABLET | Freq: Every day | ORAL | Status: DC
  Administered 2016-07-11 – 2016-07-12 (×2): 1 via ORAL
  Filled 2016-07-10 (×3): qty 1

## 2016-07-10 MED ORDER — DOCUSATE SODIUM 100 MG PO CAPS
100.0000 mg | ORAL_CAPSULE | Freq: Every day | ORAL | Status: DC
  Administered 2016-07-11 – 2016-07-12 (×2): 100 mg via ORAL
  Filled 2016-07-10 (×3): qty 1

## 2016-07-10 MED ORDER — MAGNESIUM SULFATE 50 % IJ SOLN
2.0000 g/h | INTRAVENOUS | Status: DC
  Administered 2016-07-11: 2 g/h via INTRAVENOUS
  Filled 2016-07-10 (×2): qty 80

## 2016-07-10 MED ORDER — LABETALOL HCL 200 MG PO TABS
200.0000 mg | ORAL_TABLET | Freq: Two times a day (BID) | ORAL | Status: DC
  Administered 2016-07-10: 200 mg via ORAL
  Filled 2016-07-10: qty 1

## 2016-07-10 MED ORDER — ZOLPIDEM TARTRATE 5 MG PO TABS
5.0000 mg | ORAL_TABLET | Freq: Every evening | ORAL | Status: DC | PRN
  Administered 2016-07-10: 5 mg via ORAL
  Filled 2016-07-10: qty 1

## 2016-07-10 MED ORDER — ACETAMINOPHEN 325 MG PO TABS
650.0000 mg | ORAL_TABLET | ORAL | Status: DC | PRN
  Administered 2016-07-10 – 2016-07-13 (×5): 650 mg via ORAL
  Filled 2016-07-10 (×5): qty 2

## 2016-07-10 MED ORDER — HYDRALAZINE HCL 20 MG/ML IJ SOLN
10.0000 mg | Freq: Once | INTRAMUSCULAR | Status: AC | PRN
  Administered 2016-07-10: 10 mg via INTRAVENOUS
  Filled 2016-07-10: qty 1

## 2016-07-10 MED ORDER — LACTATED RINGERS IV SOLN
INTRAVENOUS | Status: DC
  Administered 2016-07-10 – 2016-07-13 (×3): via INTRAVENOUS

## 2016-07-10 MED ORDER — HYDRALAZINE HCL 20 MG/ML IJ SOLN
10.0000 mg | Freq: Once | INTRAMUSCULAR | Status: AC
  Administered 2016-07-10: 10 mg via INTRAVENOUS

## 2016-07-10 MED ORDER — LABETALOL HCL 100 MG PO TABS
200.0000 mg | ORAL_TABLET | Freq: Once | ORAL | Status: AC
  Administered 2016-07-10: 200 mg via ORAL
  Filled 2016-07-10: qty 2

## 2016-07-10 MED ORDER — LABETALOL HCL 5 MG/ML IV SOLN
20.0000 mg | INTRAVENOUS | Status: AC | PRN
  Administered 2016-07-10: 80 mg via INTRAVENOUS
  Administered 2016-07-10: 20 mg via INTRAVENOUS
  Administered 2016-07-10: 40 mg via INTRAVENOUS
  Filled 2016-07-10: qty 16
  Filled 2016-07-10: qty 8
  Filled 2016-07-10: qty 4

## 2016-07-10 MED ORDER — CALCIUM CARBONATE ANTACID 500 MG PO CHEW: 2.0000 | CHEWABLE_TABLET | ORAL | Status: DC | PRN

## 2016-07-10 MED ORDER — HYDRALAZINE HCL 20 MG/ML IJ SOLN
10.0000 mg | Freq: Once | INTRAMUSCULAR | Status: AC | PRN
  Administered 2016-07-11: 10 mg via INTRAVENOUS
  Filled 2016-07-10 (×2): qty 1

## 2016-07-10 MED ORDER — LABETALOL HCL 5 MG/ML IV SOLN
20.0000 mg | INTRAVENOUS | Status: AC | PRN
  Administered 2016-07-11 – 2016-07-13 (×2): 20 mg via INTRAVENOUS
  Administered 2016-07-13: 40 mg via INTRAVENOUS
  Filled 2016-07-10 (×2): qty 4
  Filled 2016-07-10: qty 8

## 2016-07-10 MED ORDER — MAGNESIUM SULFATE BOLUS VIA INFUSION
6.0000 g | Freq: Once | INTRAVENOUS | Status: AC
  Administered 2016-07-10: 6 g via INTRAVENOUS
  Filled 2016-07-10: qty 500

## 2016-07-10 NOTE — H&P (Signed)
Virginia Singleton is a 31 y.o. female, G1P0, EGA 4187W3D with EDC 4-14 presenting for headache.  She had no other significant symptoms, normal BP to this point in pregnancy.  In MAU, BP significantly elevated requiring IV Labetalol, Hydralazine and PO Procardia and Labetalol.  Labs normal except elevated UPC to 2.1.  Prenatal care uncomplicated to this point except for 1:220 risk Trisomy 21.  OB History    Gravida Para Term Preterm AB Living   1 0 0 0 0 0   SAB TAB Ectopic Multiple Live Births   0 0 0 0       Past Medical History:  Diagnosis Date  . Hypercholesteremia   . Migraines    Past Surgical History:  Procedure Laterality Date  . WISDOM TOOTH EXTRACTION     Family History: family history is not on file. Social History:  reports that she has never smoked. She has never used smokeless tobacco. She reports that she drinks alcohol. She reports that she does not use drugs.     Maternal Diabetes: No Genetic Screening: Abnormal:  Results: Elevated risk of Trisomy 21 Maternal Ultrasounds/Referrals: Normal Fetal Ultrasounds or other Referrals:  None Maternal Substance Abuse:  No Significant Maternal Medications:  None Significant Maternal Lab Results:  None Other Comments:  None  Review of Systems  Respiratory: Negative.   Cardiovascular: Negative.    Maternal Medical History:  Fetal activity: Perceived fetal activity is normal.    Prenatal complications: no prenatal complications     Blood pressure (!) 136/96, pulse 82, temperature 98.1 F (36.7 C), temperature source Oral, resp. rate 16, height 5\' 6"  (1.676 m), weight 111.1 kg (245 lb), last menstrual period 12/24/2015, SpO2 99 %.   Fetal Exam Fetal Monitor Review: Mode: ultrasound.   Baseline rate: 120.  Variability: moderate (6-25 bpm).   Pattern: no accelerations and variable decelerations.    Fetal State Assessment: Category II - tracings are indeterminate.     Physical Exam  Vitals  reviewed. Constitutional: She appears well-developed and well-nourished.  Cardiovascular: Normal rate and regular rhythm.   Respiratory: Effort normal. No respiratory distress.  GI: Soft.    Prenatal labs: ABO, Rh: --/--/A POS, A POS (01/23 16100923) Antibody: NEG (01/23 0923) Rubella:  Immune RPR:   NR HBsAg:  Neg  HIV:   NR   Assessment/Plan: IUP at 28+ weeks with preeclampsia with severe features.  Have started her on magnesium for seizure and CP prophylaxis, she has received her first dose of betamethasone.  She has required IV meds to get BP down, also on Labetalol 200 mg po bid, BP improved currently.  Will monitor closely.  Discussed with patient that she may require delivery soon, will get NICU consult.   Camran Keady D 07/10/2016, 1:00 PM

## 2016-07-10 NOTE — Consult Note (Signed)
Neonatology Consult Note:  At the request of the patients obstetrician Dr. Willis Modena I met with Virginia Singleton and her husband.   She is a 31 y.o. female, G1P0, EGA [redacted]W[redacted]D with Clarke 4-14 with pregnancy complicated by preeclampsia with severe features.  She is being managed with magnesium sulfate and has received one dose of betamethasone.      We discussed morbidity/mortality at this gestional age, delivery room resuscitation, including intubation and surfactant in DR.  Discussed mechanical ventilation and risk for chronic lung disease, risk for IVH with potential for motor / cognitive deficits, ROP, NEC, sepsis, as well as temperature instability and feeding immaturity.  Discussed NG / OG feeds, benefits of MBM in reducing incidence of NEC.   Discussed likely length of stay. Thank you for allowing [place] to participate in her care.  Please call with questions.  Higinio Roger, DO  Neonatologist  The total length of face-to-face or floor / unit time for this encounter was 20 minutes.  Counseling and / or coordination of care was greater than fifty percent of the time.

## 2016-07-10 NOTE — MAU Note (Signed)
Patient presents to mau with c/o headache and nausea x4 days. Rating the headache an 8/10 while sitting and 10/10 while lying. Reports having tried tylenol at home with no relief. Denies LOF, VB, contractions at this time. +FM

## 2016-07-10 NOTE — MAU Provider Note (Signed)
History     CSN: [id number]  Arrival date and time: 07/10/16 4098   First Provider Initiated Contact with Patient 07/10/16 0900      Chief Complaint  Patient presents with  . Headache  . Nausea   HPI Virginia Singleton is a 31 y.o. G1P0000 at [redacted]w[redacted]d who presents with headache. Reports constant headache x 4 days. Has been taking 2 RS tylenol every 6 hours without relief. Reports frontal throbbing headache with associated photophobia. Rates pain 8/10. Pain worse with lights & lying in supine position. Endorses blurred vision and nausea since yesterday. Denies CP, SOB, epigastric pain, vomiting. Positive fetal movement. Denies history of hypertension.   OB History    Gravida Para Term Preterm AB Living   1 0 0 0 0 0   SAB TAB Ectopic Multiple Live Births   0 0 0 0        Past Medical History:  Diagnosis Date  . Hypercholesteremia   . Migraines     Past Surgical History:  Procedure Laterality Date  . WISDOM TOOTH EXTRACTION      History reviewed. No pertinent family history.  Social History  Substance Use Topics  . Smoking status: Never Smoker  . Smokeless tobacco: Never Used  . Alcohol use Yes     Comment: occas.    Allergies:  Allergies  Allergen Reactions  . Mushroom Extract Complex Hives and Itching  . Zithromax [Azithromycin] Hives and Rash    Prescriptions Prior to Admission  Medication Sig Dispense Refill Last Dose  . Prenatal Vit-Fe Fumarate-FA (PRENATAL MULTIVITAMIN) TABS tablet Take 1 tablet by mouth daily at 12 noon.   Taking  . promethazine (PHENERGAN) 25 MG tablet Take 0.5-1 tablets (12.5-25 mg total) by mouth every 6 (six) hours as needed. 30 tablet 1     Review of Systems  Constitutional: Negative.   Eyes: Positive for photophobia and visual disturbance.  Respiratory: Negative for shortness of breath.   Cardiovascular: Positive for leg swelling. Negative for chest pain.  Gastrointestinal: Positive for nausea. Negative for abdominal pain,  constipation, diarrhea and vomiting.  Genitourinary: Negative.   Neurological: Positive for headaches.   Physical Exam   Blood pressure (!) 146/105, pulse 80, temperature 98.5 F (36.9 C), temperature source Oral, resp. rate 18, weight 244 lb 0.6 oz (110.7 kg), last menstrual period 12/24/2015, SpO2 100 %.  Temp:  [98.5 F (36.9 C)] 98.5 F (36.9 C) (01/23 0847) Pulse Rate:  [68-82] 76 (01/23 1012) Resp:  [18] 18 (01/23 0931) BP: (146-181)/(105-123) 168/113 (01/23 1012) SpO2:  [100 %] 100 % (01/23 0847) Weight:  [244 lb 0.6 oz (110.7 kg)] 244 lb 0.6 oz (110.7 kg) (01/23 1191)   Physical Exam  Nursing note and vitals reviewed. Constitutional: She is oriented to person, place, and time. She appears well-developed and well-nourished. No distress.  HENT:  Head: Normocephalic and atraumatic.  Eyes: Conjunctivae are normal. Right eye exhibits no discharge. Left eye exhibits no discharge. No scleral icterus.  Neck: Normal range of motion.  Cardiovascular: Normal rate, regular rhythm and normal heart sounds.   No murmur heard. Respiratory: Effort normal and breath sounds normal. No respiratory distress. She has no wheezes.  GI: Soft. There is no tenderness.  Musculoskeletal: She exhibits edema (BLE, 2 +).  Neurological: She is alert and oriented to person, place, and time. She has normal reflexes.  No clonus  Skin: Skin is warm and dry. She is not diaphoretic.  Psychiatric: She has a normal mood and affect.  Her behavior is normal. Judgment and thought content normal.   Fetal Tracing:  Baseline: 140 Variability: min to mod Accelerations: 10x10 Decelerations: none  Toco: none MAU Course  Procedures Results for orders placed or performed during the hospital encounter of 07/10/16 (from the past 24 hour(s))  CBC     Status: Abnormal   Collection Time: 07/10/16  9:23 AM  Result Value Ref Range   WBC 5.9 4.0 - 10.5 K/uL   RBC 4.42 3.87 - 5.11 MIL/uL   Hemoglobin 13.3 12.0 - 15.0  g/dL   HCT 09.836.6 11.936.0 - 14.746.0 %   MCV 82.8 78.0 - 100.0 fL   MCH 30.1 26.0 - 34.0 pg   MCHC 36.3 (H) 30.0 - 36.0 g/dL   RDW 82.915.1 56.211.5 - 13.015.5 %   Platelets 206 150 - 400 K/uL  Type and screen [facility]     Status: None (Preliminary result)   Collection Time: 07/10/16  9:23 AM  Result Value Ref Range   ABO/RH(D) A POS    Antibody Screen PENDING    Sample Expiration 07/13/2016   Urinalysis, Routine w reflex microscopic     Status: Abnormal   Collection Time: 07/10/16  9:32 AM  Result Value Ref Range   Color, Urine AMBER (A) YELLOW   APPearance CLOUDY (A) CLEAR   Specific Gravity, Urine 1.024 1.005 - 1.030   pH 5.0 5.0 - 8.0   Glucose, UA NEGATIVE NEGATIVE mg/dL   Hgb urine dipstick NEGATIVE NEGATIVE   Bilirubin Urine NEGATIVE NEGATIVE   Ketones, ur NEGATIVE NEGATIVE mg/dL   Protein, ur >=865>=300 (A) NEGATIVE mg/dL   Nitrite NEGATIVE NEGATIVE   Leukocytes, UA NEGATIVE NEGATIVE   RBC / HPF 0-5 0 - 5 RBC/hpf   WBC, UA 6-30 0 - 5 WBC/hpf   Bacteria, UA MANY (A) NONE SEEN   Squamous Epithelial / LPF 6-30 (A) NONE SEEN   WBC Clumps PRESENT    Mucous PRESENT    Hyaline Casts, UA PRESENT     MDM Category 1 tracing CBC, CMP, urine PCR Fioricet 2 tabs PO Labetalol protocol for severe range BPs -- 3 doses given (20mg , 40mg , 80mg ) & severe range BPs persist, will give 10mg  Hydralazine S/w Dr. Jackelyn KnifeMeisinger regarding BPs and treatment. Will admit patient for monitoring & BP control Labs pending  Assessment and Plan  A: Preeclampsia with severe feature  P: Obs for BP management Labs pending Dr. Jackelyn KnifeMeisinger to place admit orders  Virginia Singleton 07/10/2016, 8:59 AM

## 2016-07-10 NOTE — Progress Notes (Signed)
Feeling ok, still has mild headache but better, no other symptoms Afeb, VSS, BP 130-150/80-100 FHT- 120s, mod variability, brief non-repetitive decels-probably variable Will continue magnesium and PO labetalol, recheck labs in am, 24 hour urine in progress

## 2016-07-10 NOTE — MAU Note (Signed)
Patient states have had blurry vision with current headache; states noticed a slight increase in swelling in both feet. Denies epigastric pain.

## 2016-07-11 ENCOUNTER — Observation Stay (HOSPITAL_COMMUNITY): Payer: Medicaid Other

## 2016-07-11 DIAGNOSIS — O4593 Premature separation of placenta, unspecified, third trimester: Secondary | ICD-10-CM | POA: Diagnosis present

## 2016-07-11 DIAGNOSIS — O1494 Unspecified pre-eclampsia, complicating childbirth: Secondary | ICD-10-CM | POA: Diagnosis not present

## 2016-07-11 DIAGNOSIS — O1414 Severe pre-eclampsia complicating childbirth: Secondary | ICD-10-CM | POA: Diagnosis present

## 2016-07-11 DIAGNOSIS — Z3A28 28 weeks gestation of pregnancy: Secondary | ICD-10-CM | POA: Diagnosis not present

## 2016-07-11 LAB — COMPREHENSIVE METABOLIC PANEL
ALT: 18 U/L (ref 14–54)
AST: 23 U/L (ref 15–41)
Albumin: 2.8 g/dL — ABNORMAL LOW (ref 3.5–5.0)
Alkaline Phosphatase: 73 U/L (ref 38–126)
Anion gap: 8 (ref 5–15)
BUN: 14 mg/dL (ref 6–20)
CHLORIDE: 105 mmol/L (ref 101–111)
CO2: 19 mmol/L — ABNORMAL LOW (ref 22–32)
CREATININE: 0.87 mg/dL (ref 0.44–1.00)
Calcium: 7.5 mg/dL — ABNORMAL LOW (ref 8.9–10.3)
Glucose, Bld: 129 mg/dL — ABNORMAL HIGH (ref 65–99)
POTASSIUM: 4 mmol/L (ref 3.5–5.1)
Sodium: 132 mmol/L — ABNORMAL LOW (ref 135–145)
TOTAL PROTEIN: 6.3 g/dL — AB (ref 6.5–8.1)
Total Bilirubin: 0.4 mg/dL (ref 0.3–1.2)

## 2016-07-11 LAB — CBC
HCT: 34.7 % — ABNORMAL LOW (ref 36.0–46.0)
Hemoglobin: 12.2 g/dL (ref 12.0–15.0)
MCH: 29.5 pg (ref 26.0–34.0)
MCHC: 35.2 g/dL (ref 30.0–36.0)
MCV: 83.8 fL (ref 78.0–100.0)
PLATELETS: 199 10*3/uL (ref 150–400)
RBC: 4.14 MIL/uL (ref 3.87–5.11)
RDW: 15.1 % (ref 11.5–15.5)
WBC: 12.8 10*3/uL — ABNORMAL HIGH (ref 4.0–10.5)

## 2016-07-11 LAB — MAGNESIUM: MAGNESIUM: 6.4 mg/dL — AB (ref 1.7–2.4)

## 2016-07-11 MED ORDER — LABETALOL HCL 5 MG/ML IV SOLN
20.0000 mg | INTRAVENOUS | Status: AC | PRN
  Administered 2016-07-13: 40 mg via INTRAVENOUS
  Administered 2016-07-13: 20 mg via INTRAVENOUS
  Filled 2016-07-11: qty 4
  Filled 2016-07-11: qty 8

## 2016-07-11 MED ORDER — LABETALOL HCL 100 MG PO TABS
200.0000 mg | ORAL_TABLET | Freq: Three times a day (TID) | ORAL | Status: DC
  Administered 2016-07-11 – 2016-07-13 (×7): 200 mg via ORAL
  Filled 2016-07-11 (×7): qty 1

## 2016-07-11 MED ORDER — HYDRALAZINE HCL 20 MG/ML IJ SOLN
5.0000 mg | INTRAMUSCULAR | Status: AC | PRN
Start: 2016-07-11 — End: 2016-07-11
  Administered 2016-07-11 (×2): 10 mg via INTRAVENOUS
  Filled 2016-07-11: qty 1

## 2016-07-11 NOTE — Progress Notes (Signed)
Patient ID: Virginia Singleton, female   DOB: Aug 24, 1985, 31 y.o.   MRN: [id number] Pt states headache no longer present and blurry vision improved. She was still lightheaded when attempted to get out of bed within the past hour though Currently getting bedside BPP/growth scan  VS- 159-173/105-108  Mg level elevated at 6.4 at 1045am Nl LFTs, plts   A/P: MgSO4 stopped at 11am due to level being high         Continue to monitor urine output         Will obtain BP after scan- if still elevated may need iv hydralazine         Will plan on labetalol 200mg  po tid.           Will await results of [place]

## 2016-07-11 NOTE — Plan of Care (Signed)
CRITICAL VALUE ALERT  Critical value received:  Magnesium 6.4  Date of notification:  07/11/2016  Time of notification:  11:23  Critical value read back:yes  Nurse who received alert: Donzetta Sprungebbie [place], RN  MD notified (1st page):  Dr Mindi SlickerBanga  Time of first page:  11:24  MD notified (2nd page):n/a  Time of second page:n/a  Responding MD:  n/a  Time MD responded:  11:24

## 2016-07-11 NOTE — Progress Notes (Signed)
Ambulated to bathroom, patient was a little wobbly and dizzy.

## 2016-07-11 NOTE — Progress Notes (Signed)
Patient ID: Virginia Singleton, female   DOB: 14-Jul-1985, 31 y.o.   MRN: [id number] Pt reports headache better than before but has blurry vision - did not sleep overnight ; feels fatigued. Plans to turn of all electronics and get some rest.   VS- 127-155/83-103 EFM - 120s, min variability, no decels, cat 2 TOCO - no contractions SVE - deferred  A/P: G1P0 at 28 4/[redacted]wks ga with precclampsia with                         severe features         Continue on MgSO4 till noon ( 24hrs)         S/P BMZ ; second half due to 1030am         24hr urine collection in progress till 4am 1/25         Consider changing labetalol from 200 BID to TID         Discussed expectations for delivery         BPP today         IV protocol in place

## 2016-07-11 NOTE — Progress Notes (Signed)
Patient ID: Virginia Singleton, female   DOB: Jun 19, 1985, 31 y.o.   MRN: [id number] Per nurse pt with no complaints VS 140s/90s Being monitored at this time  Plan: Give pm dose of labetalol at 2300 then q 8 hours           Vitals q 4hours

## 2016-07-11 NOTE — Progress Notes (Signed)
Patient ID: Virginia Singleton, female   DOB: [birth year], 31 y.o.   MRN: [id number] Pt with no new complaints. Headache resolved; tension in shoulders. Got upset by visitor earlier. Trying to rest now VS - 148/99  US - BPP 8/8  A/P: Prime with preecclampsia with severe features         BP getting under better control         Labetalol due shortly; will give at 1700         Heating pad for shoulders         Continue with mgmt

## 2016-07-12 LAB — PROTEIN, URINE, 24 HOUR
Collection Interval-UPROT: 24 hours
PROTEIN, URINE: 84 mg/dL
Protein, 24H Urine: 1575 mg/d — ABNORMAL HIGH (ref 50–100)
Urine Total Volume-UPROT: 1875 mL

## 2016-07-12 NOTE — Progress Notes (Signed)
Patient ID: Virginia Singleton, female   DOB: [birth year], 31 y.o.   MRN: [id number]   BP noted at 137-148/91-99  Continue with current mgmt plan

## 2016-07-12 NOTE — Progress Notes (Addendum)
Patient ID: Virginia Singleton, female   DOB: 06/06/1986, 31 y.o.   MRN: [id number]   28+5 PreEclampsia with severe features  No c/o's.  HA improved.  +FM, no LOF, no VB, no ctx  AF (130-173)/(89-112) Since yesterday pm (after labetalol to tid) 130-140/90 gen NAD Abd soft, gravid, NT FHTs 120-130's, mod var category 1 toco  Rare  Continue close monitoring S/p BMZ x 2 24 hr urine = 1575mg  protein BP control with labetalol tid Mg off Switch to tid strips

## 2016-07-13 ENCOUNTER — Encounter (HOSPITAL_COMMUNITY)
Admission: AD | Disposition: A | Discharge status: Home / Self Care | Payer: Self-pay | Source: Ambulatory Visit | Attending: Obstetrics and Gynecology

## 2016-07-13 ENCOUNTER — Inpatient Hospital Stay (HOSPITAL_COMMUNITY): Payer: Medicaid Other | Admitting: Anesthesiology

## 2016-07-13 ENCOUNTER — Encounter (HOSPITAL_COMMUNITY): Payer: Self-pay | Admitting: *Deleted

## 2016-07-13 DIAGNOSIS — O1413 Severe pre-eclampsia, third trimester: Secondary | ICD-10-CM | POA: Diagnosis present

## 2016-07-13 LAB — CBC
HEMATOCRIT: 34 % — AB (ref 36.0–46.0)
HEMOGLOBIN: 11.8 g/dL — AB (ref 12.0–15.0)
MCH: 29.6 pg (ref 26.0–34.0)
MCHC: 34.7 g/dL (ref 30.0–36.0)
MCV: 85.2 fL (ref 78.0–100.0)
Platelets: 203 10*3/uL (ref 150–400)
RBC: 3.99 MIL/uL (ref 3.87–5.11)
RDW: 15.7 % — ABNORMAL HIGH (ref 11.5–15.5)
WBC: 10.6 10*3/uL — AB (ref 4.0–10.5)

## 2016-07-13 LAB — COMPREHENSIVE METABOLIC PANEL
ALK PHOS: 66 U/L (ref 38–126)
ALT: 22 U/L (ref 14–54)
AST: 33 U/L (ref 15–41)
Albumin: 2.6 g/dL — ABNORMAL LOW (ref 3.5–5.0)
Anion gap: 4 — ABNORMAL LOW (ref 5–15)
BILIRUBIN TOTAL: 0.5 mg/dL (ref 0.3–1.2)
BUN: 16 mg/dL (ref 6–20)
CALCIUM: 7.7 mg/dL — AB (ref 8.9–10.3)
CO2: 21 mmol/L — ABNORMAL LOW (ref 22–32)
CREATININE: 0.82 mg/dL (ref 0.44–1.00)
Chloride: 109 mmol/L (ref 101–111)
GFR calc Af Amer: >60 mL/min (ref >60)
Glucose, Bld: 87 mg/dL (ref 65–99)
Potassium: 4.2 mmol/L (ref 3.5–5.1)
Sodium: 134 mmol/L — ABNORMAL LOW (ref 135–145)
Total Protein: 5.9 g/dL — ABNORMAL LOW (ref 6.5–8.1)

## 2016-07-13 LAB — TYPE AND SCREEN
ABO/RH(D): A POS
Antibody Screen: NEGATIVE

## 2016-07-13 SURGERY — Surgical Case
Anesthesia: Spinal | Wound class: Clean Contaminated

## 2016-07-13 MED ORDER — MEPERIDINE HCL 25 MG/ML IJ SOLN
INTRAMUSCULAR | Status: DC | PRN
  Administered 2016-07-13: 12.5 mg via INTRAVENOUS

## 2016-07-13 MED ORDER — ONDANSETRON HCL 4 MG/2ML IJ SOLN: 4.0000 mg | Freq: Four times a day (QID) | INTRAMUSCULAR | Status: DC | PRN

## 2016-07-13 MED ORDER — FENTANYL CITRATE (PF) 100 MCG/2ML IJ SOLN
INTRAMUSCULAR | Status: AC
  Filled 2016-07-13: qty 2

## 2016-07-13 MED ORDER — LABETALOL HCL 5 MG/ML IV SOLN: 20.0000 mg | INTRAVENOUS | Status: DC | PRN

## 2016-07-13 MED ORDER — HYDRALAZINE HCL 20 MG/ML IJ SOLN
10.0000 mg | Freq: Once | INTRAMUSCULAR | Status: DC | PRN
  Filled 2016-07-13 (×2): qty 1

## 2016-07-13 MED ORDER — ONDANSETRON HCL 4 MG/2ML IJ SOLN: 4.0000 mg | Freq: Three times a day (TID) | INTRAMUSCULAR | Status: DC | PRN

## 2016-07-13 MED ORDER — DIBUCAINE 1 % RE OINT: 1.0000 "application " | TOPICAL_OINTMENT | RECTAL | Status: DC | PRN

## 2016-07-13 MED ORDER — SODIUM CHLORIDE 0.9% FLUSH: 3.0000 mL | INTRAVENOUS | Status: DC | PRN

## 2016-07-13 MED ORDER — SIMETHICONE 80 MG PO CHEW: 80.0000 mg | CHEWABLE_TABLET | ORAL | Status: DC | PRN

## 2016-07-13 MED ORDER — DEXAMETHASONE SODIUM PHOSPHATE 4 MG/ML IJ SOLN
INTRAMUSCULAR | Status: DC | PRN
  Administered 2016-07-13: 4 mg via INTRAVENOUS

## 2016-07-13 MED ORDER — IBUPROFEN 600 MG PO TABS
600.0000 mg | ORAL_TABLET | Freq: Four times a day (QID) | ORAL | Status: DC
  Administered 2016-07-14 – 2016-07-18 (×17): 600 mg via ORAL
  Filled 2016-07-13 (×17): qty 1

## 2016-07-13 MED ORDER — DIPHENHYDRAMINE HCL 25 MG PO CAPS: 25.0000 mg | ORAL_CAPSULE | Freq: Four times a day (QID) | ORAL | Status: DC | PRN

## 2016-07-13 MED ORDER — EPHEDRINE 5 MG/ML INJ: 10.0000 mg | INTRAVENOUS | Status: DC | PRN

## 2016-07-13 MED ORDER — WITCH HAZEL-GLYCERIN EX PADS: 1.0000 "application " | MEDICATED_PAD | CUTANEOUS | Status: DC | PRN

## 2016-07-13 MED ORDER — DIPHENHYDRAMINE HCL 50 MG/ML IJ SOLN: 12.5000 mg | INTRAMUSCULAR | Status: DC | PRN

## 2016-07-13 MED ORDER — OXYTOCIN 40 UNITS IN LACTATED RINGERS INFUSION - SIMPLE MED: 2.5000 [IU]/h | INTRAVENOUS | Status: DC

## 2016-07-13 MED ORDER — CEFAZOLIN SODIUM-DEXTROSE 2-4 GM/100ML-% IV SOLN
2.0000 g | Freq: Once | INTRAVENOUS | Status: AC
  Administered 2016-07-13: 2 g via INTRAVENOUS
  Filled 2016-07-13: qty 100

## 2016-07-13 MED ORDER — SIMETHICONE 80 MG PO CHEW
80.0000 mg | CHEWABLE_TABLET | ORAL | Status: DC
  Administered 2016-07-14 – 2016-07-18 (×3): 80 mg via ORAL
  Filled 2016-07-13 (×5): qty 1

## 2016-07-13 MED ORDER — LABETALOL HCL 5 MG/ML IV SOLN
INTRAVENOUS | Status: AC
  Filled 2016-07-13: qty 4

## 2016-07-13 MED ORDER — OXYTOCIN 40 UNITS IN LACTATED RINGERS INFUSION - SIMPLE MED: 2.5000 [IU]/h | INTRAVENOUS | Status: AC

## 2016-07-13 MED ORDER — BUTORPHANOL TARTRATE 1 MG/ML IJ SOLN: 1.0000 mg | INTRAMUSCULAR | Status: DC | PRN

## 2016-07-13 MED ORDER — SCOPOLAMINE 1 MG/3DAYS TD PT72
MEDICATED_PATCH | TRANSDERMAL | Status: AC
  Filled 2016-07-13: qty 1

## 2016-07-13 MED ORDER — MAGNESIUM SULFATE 50 % IJ SOLN
2.0000 g/h | INTRAVENOUS | Status: DC
  Administered 2016-07-14: 2 g/h via INTRAVENOUS
  Filled 2016-07-13 (×2): qty 80

## 2016-07-13 MED ORDER — LACTATED RINGERS IV SOLN: 500.0000 mL | INTRAVENOUS | Status: DC | PRN

## 2016-07-13 MED ORDER — LACTATED RINGERS IV SOLN
INTRAVENOUS | Status: DC | PRN
  Administered 2016-07-13: 15:00:00 via INTRAVENOUS

## 2016-07-13 MED ORDER — MEPERIDINE HCL 25 MG/ML IJ SOLN
INTRAMUSCULAR | Status: AC
  Filled 2016-07-13: qty 1

## 2016-07-13 MED ORDER — OXYCODONE-ACETAMINOPHEN 5-325 MG PO TABS: 2.0000 | ORAL_TABLET | ORAL | Status: DC | PRN

## 2016-07-13 MED ORDER — LACTATED RINGERS IV SOLN
INTRAVENOUS | Status: DC
  Administered 2016-07-14: 03:00:00 via INTRAVENOUS

## 2016-07-13 MED ORDER — COCONUT OIL OIL
1.0000 "application " | TOPICAL_OIL | Status: DC | PRN
  Filled 2016-07-13: qty 120

## 2016-07-13 MED ORDER — SCOPOLAMINE 1 MG/3DAYS TD PT72
MEDICATED_PATCH | TRANSDERMAL | Status: DC | PRN
  Administered 2016-07-13: 1 via TRANSDERMAL

## 2016-07-13 MED ORDER — MISOPROSTOL 25 MCG QUARTER TABLET
25.0000 ug | ORAL_TABLET | ORAL | Status: DC
  Administered 2016-07-13: 25 ug via VAGINAL
  Filled 2016-07-13: qty 0.25

## 2016-07-13 MED ORDER — NALBUPHINE HCL 10 MG/ML IJ SOLN: 5.0000 mg | Freq: Once | INTRAMUSCULAR | Status: DC | PRN

## 2016-07-13 MED ORDER — PHENYLEPHRINE 40 MCG/ML (10ML) SYRINGE FOR IV PUSH (FOR BLOOD PRESSURE SUPPORT): 80.0000 ug | PREFILLED_SYRINGE | INTRAVENOUS | Status: DC | PRN

## 2016-07-13 MED ORDER — LABETALOL HCL 5 MG/ML IV SOLN
INTRAVENOUS | Status: AC
  Filled 2016-07-13: qty 8

## 2016-07-13 MED ORDER — METOCLOPRAMIDE HCL 5 MG/ML IJ SOLN
INTRAMUSCULAR | Status: DC | PRN
  Administered 2016-07-13: 10 mg via INTRAVENOUS

## 2016-07-13 MED ORDER — MENTHOL 3 MG MT LOZG: 1.0000 | LOZENGE | OROMUCOSAL | Status: DC | PRN

## 2016-07-13 MED ORDER — METOCLOPRAMIDE HCL 5 MG/ML IJ SOLN
INTRAMUSCULAR | Status: AC
  Filled 2016-07-13: qty 2

## 2016-07-13 MED ORDER — ACETAMINOPHEN 325 MG PO TABS: 650.0000 mg | ORAL_TABLET | ORAL | Status: DC | PRN

## 2016-07-13 MED ORDER — HYDRALAZINE HCL 20 MG/ML IJ SOLN
5.0000 mg | INTRAMUSCULAR | Status: DC | PRN
  Administered 2016-07-13: 5 mg via INTRAVENOUS
  Filled 2016-07-13: qty 1

## 2016-07-13 MED ORDER — NALOXONE HCL 2 MG/2ML IJ SOSY: 1.0000 ug/kg/h | PREFILLED_SYRINGE | INTRAVENOUS | Status: DC | PRN

## 2016-07-13 MED ORDER — ACETAMINOPHEN 500 MG PO TABS
1000.0000 mg | ORAL_TABLET | Freq: Four times a day (QID) | ORAL | Status: AC
  Administered 2016-07-14 (×3): 1000 mg via ORAL
  Filled 2016-07-13 (×3): qty 2

## 2016-07-13 MED ORDER — LABETALOL HCL 200 MG PO TABS
200.0000 mg | ORAL_TABLET | Freq: Three times a day (TID) | ORAL | Status: DC
  Administered 2016-07-13 – 2016-07-15 (×5): 200 mg via ORAL
  Filled 2016-07-13 (×5): qty 1

## 2016-07-13 MED ORDER — NALOXONE HCL 0.4 MG/ML IJ SOLN: 0.4000 mg | INTRAMUSCULAR | Status: DC | PRN

## 2016-07-13 MED ORDER — ONDANSETRON HCL 4 MG/2ML IJ SOLN
INTRAMUSCULAR | Status: DC | PRN
  Administered 2016-07-13: 4 mg via INTRAVENOUS

## 2016-07-13 MED ORDER — TETANUS-DIPHTH-ACELL PERTUSSIS 5-2.5-18.5 LF-MCG/0.5 IM SUSP
0.5000 mL | Freq: Once | INTRAMUSCULAR | Status: AC
  Administered 2016-07-15: 0.5 mL via INTRAMUSCULAR
  Filled 2016-07-13: qty 0.5

## 2016-07-13 MED ORDER — LABETALOL HCL 5 MG/ML IV SOLN
INTRAVENOUS | Status: AC
  Administered 2016-07-13: 80 mg via INTRAVENOUS
  Filled 2016-07-13: qty 16

## 2016-07-13 MED ORDER — SOD CITRATE-CITRIC ACID 500-334 MG/5ML PO SOLN
30.0000 mL | ORAL | Status: DC | PRN
  Administered 2016-07-13: 30 mL via ORAL
  Filled 2016-07-13: qty 15

## 2016-07-13 MED ORDER — MEPERIDINE HCL 25 MG/ML IJ SOLN: 6.2500 mg | INTRAMUSCULAR | Status: DC | PRN

## 2016-07-13 MED ORDER — LIDOCAINE HCL (PF) 1 % IJ SOLN
30.0000 mL | INTRAMUSCULAR | Status: DC | PRN
  Filled 2016-07-13: qty 30

## 2016-07-13 MED ORDER — HYDRALAZINE HCL 20 MG/ML IJ SOLN
10.0000 mg | Freq: Once | INTRAMUSCULAR | Status: AC | PRN
  Administered 2016-07-13: 10 mg via INTRAVENOUS
  Filled 2016-07-13: qty 1

## 2016-07-13 MED ORDER — OXYTOCIN BOLUS FROM INFUSION: 500.0000 mL | Freq: Once | INTRAVENOUS | Status: DC

## 2016-07-13 MED ORDER — LABETALOL HCL 5 MG/ML IV SOLN
80.0000 mg | Freq: Once | INTRAVENOUS | Status: AC
  Administered 2016-07-13: 80 mg via INTRAVENOUS

## 2016-07-13 MED ORDER — LACTATED RINGERS IV SOLN: 500.0000 mL | Freq: Once | INTRAVENOUS | Status: DC

## 2016-07-13 MED ORDER — LACTATED RINGERS IV SOLN
INTRAVENOUS | Status: DC | PRN
  Administered 2016-07-13: 14:00:00 via INTRAVENOUS

## 2016-07-13 MED ORDER — PHENYLEPHRINE 8 MG IN D5W 100 ML (0.08MG/ML) PREMIX OPTIME
INJECTION | INTRAVENOUS | Status: DC | PRN
  Administered 2016-07-13: 20 ug/min via INTRAVENOUS

## 2016-07-13 MED ORDER — ONDANSETRON HCL 4 MG/2ML IJ SOLN
INTRAMUSCULAR | Status: AC
  Filled 2016-07-13: qty 2

## 2016-07-13 MED ORDER — DEXAMETHASONE SODIUM PHOSPHATE 4 MG/ML IJ SOLN
INTRAMUSCULAR | Status: AC
  Filled 2016-07-13: qty 1

## 2016-07-13 MED ORDER — PRENATAL MULTIVITAMIN CH
1.0000 | ORAL_TABLET | Freq: Every day | ORAL | Status: DC
  Administered 2016-07-14 – 2016-07-17 (×4): 1 via ORAL
  Filled 2016-07-13 (×4): qty 1

## 2016-07-13 MED ORDER — FENTANYL 2.5 MCG/ML BUPIVACAINE 1/10 % EPIDURAL INFUSION (WH - ANES): 14.0000 mL/h | INTRAMUSCULAR | Status: DC | PRN

## 2016-07-13 MED ORDER — SCOPOLAMINE 1 MG/3DAYS TD PT72: 1.0000 | MEDICATED_PATCH | Freq: Once | TRANSDERMAL | Status: DC

## 2016-07-13 MED ORDER — FENTANYL CITRATE (PF) 100 MCG/2ML IJ SOLN: 25.0000 ug | INTRAMUSCULAR | Status: DC | PRN

## 2016-07-13 MED ORDER — PHENYLEPHRINE 8 MG IN D5W 100 ML (0.08MG/ML) PREMIX OPTIME: INJECTION | INTRAVENOUS | Status: DC | PRN

## 2016-07-13 MED ORDER — NALBUPHINE HCL 10 MG/ML IJ SOLN: 5.0000 mg | INTRAMUSCULAR | Status: DC | PRN

## 2016-07-13 MED ORDER — OXYTOCIN 10 UNIT/ML IJ SOLN
INTRAMUSCULAR | Status: AC
  Filled 2016-07-13: qty 4

## 2016-07-13 MED ORDER — MORPHINE SULFATE (PF) 0.5 MG/ML IJ SOLN
INTRAMUSCULAR | Status: DC | PRN
  Administered 2016-07-13: .1 mg via INTRATHECAL

## 2016-07-13 MED ORDER — LABETALOL HCL 5 MG/ML IV SOLN
20.0000 mg | INTRAVENOUS | Status: AC | PRN
  Administered 2016-07-13: 40 mg via INTRAVENOUS
  Administered 2016-07-13 – 2016-07-15 (×2): 20 mg via INTRAVENOUS
  Filled 2016-07-13: qty 4

## 2016-07-13 MED ORDER — SENNOSIDES-DOCUSATE SODIUM 8.6-50 MG PO TABS
2.0000 | ORAL_TABLET | ORAL | Status: DC
  Administered 2016-07-14 – 2016-07-18 (×4): 2 via ORAL
  Filled 2016-07-13 (×5): qty 2

## 2016-07-13 MED ORDER — MORPHINE SULFATE (PF) 0.5 MG/ML IJ SOLN
INTRAMUSCULAR | Status: AC
  Filled 2016-07-13: qty 10

## 2016-07-13 MED ORDER — MAGNESIUM SULFATE BOLUS VIA INFUSION
4.0000 g | Freq: Once | INTRAVENOUS | Status: AC
  Administered 2016-07-13: 4 g via INTRAVENOUS
  Filled 2016-07-13: qty 500

## 2016-07-13 MED ORDER — ZOLPIDEM TARTRATE 5 MG PO TABS: 5.0000 mg | ORAL_TABLET | Freq: Every evening | ORAL | Status: DC | PRN

## 2016-07-13 MED ORDER — SIMETHICONE 80 MG PO CHEW
80.0000 mg | CHEWABLE_TABLET | Freq: Three times a day (TID) | ORAL | Status: DC
  Administered 2016-07-14 – 2016-07-18 (×11): 80 mg via ORAL
  Filled 2016-07-13 (×12): qty 1

## 2016-07-13 MED ORDER — OXYTOCIN 10 UNIT/ML IJ SOLN
INTRAMUSCULAR | Status: DC | PRN
  Administered 2016-07-13: 40 [IU] via INTRAVENOUS

## 2016-07-13 MED ORDER — DIPHENHYDRAMINE HCL 25 MG PO CAPS: 25.0000 mg | ORAL_CAPSULE | ORAL | Status: DC | PRN

## 2016-07-13 MED ORDER — OXYCODONE HCL 5 MG PO TABS
10.0000 mg | ORAL_TABLET | Freq: Four times a day (QID) | ORAL | Status: DC | PRN
  Administered 2016-07-13: 10 mg via ORAL
  Filled 2016-07-13: qty 2

## 2016-07-13 MED ORDER — OXYCODONE-ACETAMINOPHEN 5-325 MG PO TABS: 1.0000 | ORAL_TABLET | ORAL | Status: DC | PRN

## 2016-07-13 SURGICAL SUPPLY — 33 items
BENZOIN TINCTURE PRP APPL 2/3 (GAUZE/BANDAGES/DRESSINGS) ×2 IMPLANT
CHLORAPREP W/TINT 26ML (MISCELLANEOUS) ×2 IMPLANT
CLAMP CORD UMBIL (MISCELLANEOUS) IMPLANT
CLOSURE STERI STRIP 1/2 X4 (GAUZE/BANDAGES/DRESSINGS) ×2 IMPLANT
CLOTH BEACON ORANGE TIMEOUT ST (SAFETY) ×2 IMPLANT
DRSG OPSITE POSTOP 4X10 (GAUZE/BANDAGES/DRESSINGS) ×2 IMPLANT
ELECT REM PT RETURN 9FT ADLT (ELECTROSURGICAL) ×2
ELECTRODE REM PT RTRN 9FT ADLT (ELECTROSURGICAL) ×1 IMPLANT
EXTRACTOR VACUUM KIWI (MISCELLANEOUS) IMPLANT
GLOVE BIO SURGEON STRL SZ 6.5 (GLOVE) ×2 IMPLANT
GLOVE BIOGEL PI IND STRL 7.0 (GLOVE) ×1 IMPLANT
GLOVE BIOGEL PI INDICATOR 7.0 (GLOVE) ×1
GOWN STRL REUS W/TWL LRG LVL3 (GOWN DISPOSABLE) ×4 IMPLANT
KIT ABG SYR 3ML LUER SLIP (SYRINGE) IMPLANT
NEEDLE HYPO 25X5/8 SAFETYGLIDE (NEEDLE) IMPLANT
NS IRRIG 1000ML POUR BTL (IV SOLUTION) ×2 IMPLANT
PACK C SECTION WH (CUSTOM PROCEDURE TRAY) ×2 IMPLANT
PAD OB MATERNITY 4.3X12.25 (PERSONAL CARE ITEMS) ×2 IMPLANT
PENCIL SMOKE EVAC W/HOLSTER (ELECTROSURGICAL) ×2 IMPLANT
RTRCTR C-SECT PINK 25CM LRG (MISCELLANEOUS) ×2 IMPLANT
STRIP CLOSURE SKIN 1/2X4 (GAUZE/BANDAGES/DRESSINGS) IMPLANT
SUT CHROMIC 1 CTX 36 (SUTURE) ×4 IMPLANT
SUT PLAIN 0 NONE (SUTURE) IMPLANT
SUT PLAIN 2 0 XLH (SUTURE) ×2 IMPLANT
SUT VIC AB 0 CT1 27 (SUTURE) ×2
SUT VIC AB 0 CT1 27XBRD ANBCTR (SUTURE) ×2 IMPLANT
SUT VIC AB 2-0 CT1 27 (SUTURE) ×1
SUT VIC AB 2-0 CT1 TAPERPNT 27 (SUTURE) ×1 IMPLANT
SUT VIC AB 3-0 CT1 27 (SUTURE)
SUT VIC AB 3-0 CT1 TAPERPNT 27 (SUTURE) IMPLANT
SUT VIC AB 4-0 KS 27 (SUTURE) ×2 IMPLANT
TOWEL OR 17X24 6PK STRL BLUE (TOWEL DISPOSABLE) ×2 IMPLANT
TRAY FOLEY CATH SILVER 14FR (SET/KITS/TRAYS/PACK) ×2 IMPLANT

## 2016-07-13 NOTE — Progress Notes (Signed)
Patient ID: Virginia Singleton, female   DOB: 1986/06/06, 31 y.o.   MRN: [id number] Pt just arrived to L&D and magnesium started Dull HA, but no other sx  BP 130/90's on magnesium  Labs ok Cervix l/cl Bedside US confirms still vertex  D/w pt care plan in detail Cytotec #1 placed

## 2016-07-13 NOTE — Progress Notes (Addendum)
Patient ID: Virginia Singleton, female   DOB: 01-Aug-1985, 31 y.o.   MRN: [id number]    28+6 PreEclampsia with severe features  Pt with elevated BP and HA overnight, despite BP being controlled all day yesterday (130-140/90), now BP 150-180/90-100+ D/W pt IOL, baby was vtx on last US.  D/W pt PreE with severe features, BP worsening despite BP meds also symptoms with HA.  Has received BMZ for baby.    D/W pt also via telephone her mother  Will call NICU for reconsult.     AF BP 159/102 Has received IV labetalol protocol and dose of hydralazine.   Will start Magnesium 4/2 Will transfer to L&D  CBC and CMP are WNL checked this AM Plt 200, nl LFTs and Cr

## 2016-07-13 NOTE — Op Note (Signed)
Operative Note    Preoperative Diagnosis Preterm pregnancy at 28 6/7 weeks Preeclampsia with severe features and uncontrolled blood pressure Fetal heart rate decelerations remote from delivery  Postoperative Diagnosis Same with possible partial abruption  Procedure Primary Low Transverse C-section with 2 layer closure of uterus  Surgeon Huel CoteKathy Allex Lapoint, MD  Anesthesia Spinal  Fluids: EBL 500ml UOP 300cc clear IVF  600ml LR  Findings Viable female infant in vertex presentation.  Apgars 7,8.  Weight 1#15oz NICU in attendance.  Normal uterus tubes and ovaries  Specimen Placenta to pathology  Procedure Note   Patient was taken to the operating room where spinal anesthesia was obtained and found to be adequate by Allis clamp test. She was prepped and draped in the normal sterile fashion in the dorsal supine position with a leftward tilt. An appropriate time out was performed. A Pfannenstiel skin incision was then made with the scalpel and carried through to the underlying layer of fascia by sharp dissection and Bovie cautery. The fascia was nicked in the midline and the incision was extended laterally with Mayo scissors. The inferior aspect of the incision was grasped Coker clamps and dissected off the underlying rectus muscles. In a similar fashion the superior aspect was dissected off the rectus muscles. Rectus muscles were separated in the midline and the peritoneal cavity entered bluntly. The peritoneal incision was then extended both superiorly and inferiorly with careful attention to avoid both bowel and bladder. The Alexis self-retaining wound retractor was then placed within the incision and the lower uterine segment exposed. The bladder flap was developed with Metzenbaum scissors and pushed away from the lower uterine segment. The lower uterine segment was then incised in a transverse fashion and the cavity itself entered bluntly. The incision was extended bluntly. The  infant's head was then lifted and delivered from the incision without difficulty. The remainder of the infant delivered and the nose and mouth bulb suctioned with the cord clamped and cut as well. The infant was handed off to the waiting neonatologists after a one minute cord clamping delay.  The placenta was then spontaneously expressed from the uterus and the uterus cleared of all clots and debris with moist lap sponge. The uterine incision was then repaired in 2 layers the first layer was a running locked layer 1-0 chromic and the second an imbricating layer of the same suture. The tubes and ovaries were inspected and the gutters cleared of all clots and debris. The uterine incision was inspected and found to be hemostatic. All instruments and sponges as well as the Alexis retractor were then removed from the abdomen. The rectus muscles and peritoneum were then reapproximated with several interrupted mattress sutures of 2-0 Vicryl. The fascia was then closed with 0 Vicryl in a running fashion. Subcutaneous tissue was reapproximated with 3-0 plain in a running fashion. The skin was closed with a subcuticular stitch of 4-0 Vicryl on a Keith needle and then reinforced with benzoin and Steri-Strips. At the conclusion of the procedure all instruments and sponge counts were correct. Patient was taken to the recovery room in good condition and her baby was taken to the NICU.

## 2016-07-13 NOTE — Anesthesia Postprocedure Evaluation (Addendum)
Anesthesia Post Note  Patient: Forensic psychologistChandelle Caligiuri  Procedure(s) Performed: Procedure(s) (LRB): CESAREAN SECTION (N/A)  Patient location during evaluation: PACU (Awaiting transport to Women's Unit.) Anesthesia Type: Spinal Level of consciousness: awake and alert and oriented Pain management: satisfactory to patient Vital Signs Assessment: post-procedure vital signs reviewed and stable Respiratory status: spontaneous breathing and respiratory function stable Cardiovascular status: blood pressure returned to baseline (Patient has preeclampsia with severe features. She was on IV blood pressure medication (Labetolol) and Magnesium prior to surgery and continues with the same post-procedure.Marland Kitchen. ) Postop Assessment: no headache, no backache, spinal receding, patient able to bend at knees and no signs of nausea or vomiting Anesthetic complications: no        Last Vitals:  Vitals:   07/13/16 1738 07/13/16 1745  BP: (!) 148/102 (!) 136/100  Pulse: 69 68  Resp: 15 15  Temp:      Last Pain:  Vitals:   07/13/16 1700  TempSrc: Oral  PainSc: 0-No pain   Pain Goal: Patients Stated Pain Goal: 5 (07/13/16 1301)               Naysa Puskas

## 2016-07-13 NOTE — Progress Notes (Signed)
Pt ambulated off unit to vending without notifying nurse, despite having headache and elevated blood pressure. Will place on monitor once back in room.

## 2016-07-13 NOTE — Anesthesia Preprocedure Evaluation (Signed)
Anesthesia Evaluation  Patient identified by MRN, date of birth, ID band Patient awake    Reviewed: Allergy & Precautions, H&P , Patient's Chart, lab work & pertinent test results, reviewed documented beta blocker date and time   Airway Mallampati: II  TM Distance: >3 FB Neck ROM: full    Dental no notable dental hx.    Pulmonary    Pulmonary exam normal breath sounds clear to auscultation       Cardiovascular  Rhythm:regular Rate:Normal     Neuro/Psych    GI/Hepatic   Endo/Other    Renal/GU      Musculoskeletal   Abdominal   Peds  Hematology   Anesthesia Other Findings   Reproductive/Obstetrics                             Anesthesia Physical Anesthesia Plan  ASA: III and emergent  Anesthesia Plan: Spinal   Post-op Pain Management:    Induction:   Airway Management Planned:   Additional Equipment:   Intra-op Plan:   Post-operative Plan:   Informed Consent: I have reviewed the patients History and Physical, chart, labs and discussed the procedure including the risks, benefits and alternatives for the proposed anesthesia with the patient or authorized representative who has indicated his/her understanding and acceptance.   Dental Advisory Given  Plan Discussed with: CRNA  Anesthesia Plan Comments: (Lab work and procedure  confirmed with CRNA in room; platelets okay. Discussed spinal anesthetic, and patient consents to the procedure:  included risk of possible headache,backache, failed block, allergic reaction, and nerve injury. This patient was asked if she had any questions or concerns before the procedure started.  )        Anesthesia Quick Evaluation

## 2016-07-13 NOTE — Progress Notes (Signed)
Patient ID: Virginia Singleton, female   DOB: 25-Nov-1985, 31 y.o.   MRN: [id number] CTSP for deep variable decels and difficulty tracing FHR  Found with US and back up to 130 but on review of strip had several decelerations down to 50-60 and pt with minimal contractions D/w pt that the baby is not tolerating even minimal labor and as preemie remote from delivery I feel it is safest to proceed with c-section.   We discussed risks/benefits in detail and the OR is notified.  FHR back up for now so will proceed as soon as ready.

## 2016-07-13 NOTE — Anesthesia Procedure Notes (Signed)
Spinal  Patient location during procedure: OR Staffing Anesthesiologist: Cristela BlueJACKSON, Annete Ayuso Preanesthetic Checklist Completed: patient identified, site marked, surgical consent, pre-op evaluation, timeout performed, IV checked, risks and benefits discussed and monitors and equipment checked Spinal Block Patient position: sitting Prep: DuraPrep Patient monitoring: heart rate, cardiac monitor, continuous pulse ox and blood pressure Approach: midline Location: L3-4 Injection technique: single-shot Needle Needle type: Sprotte  Needle gauge: 24 G Needle length: 9 cm Assessment Sensory level: T4 Additional Notes Spinal Dosage in OR  Bupivicaine ml       1.5 PFMS04   mcg        100

## 2016-07-13 NOTE — Consult Note (Signed)
Follow up consult requested by Dr Hinton RaoBovard-Stuckert. Ms Virginia Singleton was seen by Dr Algernon Huxleyattray on 1/23 but has additional questions.   I spoke to Ms Virginia Singleton in her room. She needed reiteration of process of resuscitation. I reviewed the evaluation process we go through after the baby is born and answered her questions.  Thank you for this consult.  Virginia Garfinkelita Q Kierrah Kilbride MD Neonatologist

## 2016-07-13 NOTE — Anesthesia Pain Management Evaluation Note (Signed)
  CRNA Pain Management Visit Note  Patient: Virginia Singleton, 31 y.o., female  "Hello I am a member of the anesthesia team at [facility]. We have an anesthesia team available at all times to provide care throughout the hospital, including epidural management and anesthesia for C-section. I don't know your plan for the delivery whether it a natural birth, water birth, IV sedation, nitrous supplementation, doula or epidural, but we want to meet your pain goals."   1.Was your pain managed to your expectations on prior hospitalizations?   No prior hospitalizations  2.What is your expectation for pain management during this hospitalization?     Epidural  3.How can we help you reach that goal? epidural  Record the patient's initial score and the patient's pain goal.   Pain: 5 Pt c/o headache and is on Mag  Pain Goal: 6 [facility] wants you to be able to say your pain was always managed very well.  [facility] LLCWRINKLE,Virginia Singleton 07/13/2016

## 2016-07-13 NOTE — Lactation Note (Signed)
This note was copied from a baby's chart. Lactation Consultation Note  Patient Name: Virginia Casimiro NeedleChandelle Vizcarrondo AVWUJ'WToday's Date: 07/13/2016 Reason for consult: Initial assessment;NICU baby;Infant < 6lbs   Initial consult with first time mom of 28w 6d GA infant in NICU. Mom with preeclampsia on MgSO4. Mom reports she would like to breastfeed. Attempted to hand express mom, no colostrum was obtained. Mom very sleepy and will need further teaching. Discussed with mom that pumping every 2-3 hours is recommended to stimulate milk production. Will need to follow up later today to have pump set up.    Maternal Data Formula Feeding for Exclusion: No Has patient been taught Hand Expression?: Yes Does the patient have breastfeeding experience prior to this delivery?: No  Feeding    LATCH Score/Interventions                      Lactation Tools Discussed/Used WIC Program: Yes   Consult Status Consult Status: Follow-up Date: 07/14/16 Follow-up type: In-patient    Virginia Singleton 07/13/2016, 4:11 PM

## 2016-07-13 NOTE — Transfer of Care (Signed)
Immediate Anesthesia Transfer of Care Note  Patient: Virginia Singleton  Procedure(s) Performed: Procedure(s): CESAREAN SECTION (N/A)  Patient Location: PACU  Anesthesia Type:Spinal  Level of Consciousness: awake, alert  and oriented  Airway & Oxygen Therapy: Patient Spontanous Breathing  Post-op Assessment: Report given to RN and Post -op Vital signs reviewed and stable  Post vital signs: Reviewed and stable  Last Vitals:  Vitals:   07/13/16 1401 07/13/16 1416  BP: (!) 157/102   Pulse: 72   Resp: 18 20  Temp: 36.8 C     Last Pain:  Vitals:   07/13/16 1401  TempSrc: Axillary  PainSc:       Patients Stated Pain Goal: 5 (07/13/16 1301)  Complications: No apparent anesthesia complications

## 2016-07-14 LAB — COMPREHENSIVE METABOLIC PANEL
ALBUMIN: 2.5 g/dL — AB (ref 3.5–5.0)
ALT: 21 U/L (ref 14–54)
ANION GAP: 5 (ref 5–15)
AST: 28 U/L (ref 15–41)
Alkaline Phosphatase: 65 U/L (ref 38–126)
BILIRUBIN TOTAL: 0.3 mg/dL (ref 0.3–1.2)
BUN: 15 mg/dL (ref 6–20)
CO2: 23 mmol/L (ref 22–32)
Calcium: 6.8 mg/dL — ABNORMAL LOW (ref 8.9–10.3)
Chloride: 105 mmol/L (ref 101–111)
Creatinine, Ser: 0.93 mg/dL (ref 0.44–1.00)
GFR calc Af Amer: >60 mL/min (ref >60)
GFR calc non Af Amer: >60 mL/min (ref >60)
GLUCOSE: 95 mg/dL (ref 65–99)
Potassium: 4.7 mmol/L (ref 3.5–5.1)
Sodium: 133 mmol/L — ABNORMAL LOW (ref 135–145)
Total Protein: 5.4 g/dL — ABNORMAL LOW (ref 6.5–8.1)

## 2016-07-14 LAB — CBC
HEMATOCRIT: 31.4 % — AB (ref 36.0–46.0)
Hemoglobin: 10.8 g/dL — ABNORMAL LOW (ref 12.0–15.0)
MCH: 29.1 pg (ref 26.0–34.0)
MCHC: 34.4 g/dL (ref 30.0–36.0)
MCV: 84.6 fL (ref 78.0–100.0)
Platelets: 193 10*3/uL (ref 150–400)
RBC: 3.71 MIL/uL — AB (ref 3.87–5.11)
RDW: 15.8 % — ABNORMAL HIGH (ref 11.5–15.5)
WBC: 15.4 10*3/uL — ABNORMAL HIGH (ref 4.0–10.5)

## 2016-07-14 LAB — RPR
RPR Ser Ql: NONREACTIVE
RPR: NONREACTIVE

## 2016-07-14 MED ORDER — OXYCODONE-ACETAMINOPHEN 5-325 MG PO TABS
1.0000 | ORAL_TABLET | ORAL | Status: DC | PRN
  Administered 2016-07-14 – 2016-07-16 (×4): 1 via ORAL
  Administered 2016-07-16: 2 via ORAL
  Administered 2016-07-16: 1 via ORAL
  Filled 2016-07-14: qty 1
  Filled 2016-07-14: qty 2
  Filled 2016-07-14 (×4): qty 1

## 2016-07-14 NOTE — Lactation Note (Signed)
This note was copied from a baby's chart. Lactation Consultation Note  Patient Name: Girl Casimiro NeedleChandelle Jasinski NWGNF'AToday's Date: 07/14/2016 Reason for consult: Follow-up assessment;NICU baby  NICU baby 5025 hours old. Mom reports that she has been pumping and not seeing a lot of colostrum. Enc mom to continue to pump a minimum of 8 times/24 hours followed by hand expression. Assisted mom with hand expression with drops of colostrum present. Discussed progression of milk coming to volume, and enc mom to use EBM for sore nipples. Refitted mom with #27 flanges and mom reported increased comfort. Mom aware of pumping rooms in NICU. Mom reports that [facility] and gave [facility] to send BF referral--and [facility]. [facility].   Maternal Data Has patient been taught Hand Expression?: Yes  Feeding    LATCH Score/Interventions                      Lactation Tools Discussed/Used Pump Review: Setup, frequency, and cleaning Initiated by:: Bedside RN Date initiated:: 07/13/16   Consult Status Consult Status: Follow-up Date: 07/15/16 Follow-up type: In-patient    Sherlyn HayJennifer D Joevanni Roddey 07/14/2016, 4:00 PM

## 2016-07-14 NOTE — Progress Notes (Signed)
Subjective: Postpartum Day 1 Cesarean Delivery, severe preeclampsia Patient reports tolerating PO.  Feels terrible on magnesium and would like to come off  Objective: Vital signs in last 24 hours: Temp:  [97.9 F (36.6 C)-98.4 F (36.9 C)] 98 F (36.7 C) (01/27 0611) Pulse Rate:  [62-91] 64 (01/27 0611) Resp:  [12-24] 18 (01/27 0100) BP: (116-182)/(83-116) 144/97 (01/27 0611) SpO2:  [94 %-100 %] 95 % (01/27 0611) Weight:  [111.1 kg (245 lb)] 111.1 kg (245 lb) (01/26 1032)  Physical Exam:  General: alert and cooperative Lochia: appropriate Uterine Fundus: firm Incision: C/D/I    Recent Labs  07/13/16 0633 07/14/16 0501  HGB 11.8* 10.8*  HCT 34.0* 31.4*    Assessment/Plan: Status post Cesarean section.  Pt diuresing well, labs WNL this AM BP stable on labetalol 200mg  po TID  D/c magnesium and foley  Virginia Singleton W 07/14/2016, 9:31 AM

## 2016-07-15 MED ORDER — LABETALOL HCL 5 MG/ML IV SOLN
INTRAVENOUS | Status: AC
  Filled 2016-07-15: qty 8

## 2016-07-15 MED ORDER — LABETALOL HCL 200 MG PO TABS
300.0000 mg | ORAL_TABLET | Freq: Three times a day (TID) | ORAL | Status: DC
  Administered 2016-07-15 – 2016-07-16 (×3): 300 mg via ORAL
  Filled 2016-07-15 (×3): qty 1

## 2016-07-15 MED ORDER — NIFEDIPINE ER OSMOTIC RELEASE 30 MG PO TB24
60.0000 mg | ORAL_TABLET | Freq: Every day | ORAL | Status: DC
  Administered 2016-07-15: 60 mg via ORAL
  Filled 2016-07-15: qty 2

## 2016-07-15 MED ORDER — LABETALOL HCL 5 MG/ML IV SOLN
20.0000 mg | INTRAVENOUS | Status: DC | PRN
  Administered 2016-07-15: 40 mg via INTRAVENOUS
  Filled 2016-07-15: qty 16

## 2016-07-15 NOTE — Progress Notes (Signed)
Dr. Senaida Oresichardson updated on patients blood pressures.  Will continue to monitor.  No orders or changes at this time.

## 2016-07-15 NOTE — Progress Notes (Signed)
0416 blood pressure 162/100 and states she has a headache DTR's WNL no clonus.Labetalol 20 mg IV given per protocol.Blood pressure now 182/111 at 0458 and Labetalol 40 mg IV given.Blood pressure now 162/105.Labetalol 80 mg IV given.[id number] blood pressure 162/99 and po labetalol was also given as ordered.Headache better Percocet 1 po had also been given.Prior to pushing 10 mg of Hydralazine blood pressure checked once more and is now at 149/97,so the Hydralazine was held.

## 2016-07-15 NOTE — Progress Notes (Signed)
Subjective: Postpartum Day 2: Cesarean Delivery Patient reports incisional pain, tolerating PO and no problems voiding.  Feels much better off magnesium.  Mild HA today  Objective: Vital signs in last 24 hours: Temp:  [98.5 F (36.9 C)-98.8 F (37.1 C)] 98.7 F (37.1 C) (01/28 0808) Pulse Rate:  [64-88] 88 (01/28 0808) Resp:  [16-20] 18 (01/28 0808) BP: (132-182)/(87-111) 132/87 (01/28 0808) SpO2:  [95 %-100 %] 100 % (01/28 16100808)  Physical Exam:  General: alert and cooperative Lochia: appropriate Uterine Fundus: firm Incision: C/D/I    Recent Labs  07/13/16 0633 07/14/16 0501  HGB 11.8* 10.8*  HCT 34.0* 31.4*    Assessment/Plan: Status post Cesarean section. Doing well postoperatively. BP elevated last PM and required additional IV labetalol.  BP ok this AM.  Will increase labetalol to 300mg  po TID and if BP still high, add procardia. Baby stable in NICU  Travian Kerner W 07/15/2016, 10:10 AM

## 2016-07-15 NOTE — Lactation Note (Signed)
This note was copied from a baby's chart. Lactation Consultation Note  Patient Name: Virginia Singleton JWJXB'JToday's Date: 07/15/2016 Reason for consult: Follow-up assessment;NICU baby  NICU baby 2950 hours old. Mom reports that she has just finished using DEBP and is not seeing any EBM. Assisted mom with hand expression and collection of EBM. Mom able to collect several drops, and intends to take to NICU soon. Mom reports that she has been able to hold the baby STS as well. Dicussed progression of milk coming to volume and enc mom to pump every 2-3 hours followed by hand expression. Mom reports that she has pumped 3 times today, but intends to pump more often. Discussed with mom how important the first 2 weeks of regular pumping are to her milk production for the entire time she produces milk for this baby.   Maternal Data    Feeding Feeding Type: (P) Donor Breast Milk Length of feed: (P) 5 min  LATCH Score/Interventions                      Lactation Tools Discussed/Used     Consult Status Consult Status: Follow-up Date: 07/16/16 Follow-up type: In-patient    Virginia Singleton 07/15/2016, 5:33 PM

## 2016-07-16 MED ORDER — LABETALOL HCL 200 MG PO TABS
300.0000 mg | ORAL_TABLET | Freq: Two times a day (BID) | ORAL | Status: DC
  Administered 2016-07-16 – 2016-07-18 (×4): 300 mg via ORAL
  Filled 2016-07-16 (×4): qty 1

## 2016-07-16 MED ORDER — NIFEDIPINE ER OSMOTIC RELEASE 30 MG PO TB24
90.0000 mg | ORAL_TABLET | Freq: Every day | ORAL | Status: DC
  Administered 2016-07-16 – 2016-07-18 (×3): 90 mg via ORAL
  Filled 2016-07-16 (×3): qty 3

## 2016-07-16 NOTE — Lactation Note (Signed)
This note was copied from a baby's chart. Lactation Consultation Note  Patient Name: Virginia Singleton [name] WUJWJ'XToday's Date: 07/16/2016 Reason for consult: Follow-up assessment   With this mom of a NICU baby, now 4966 hours old, and 29 2/7 weeks CGA. I assisted mom with hand expression, and she was thrilled to be able to express about 1 ml to bring to her baby, of clear colostrum. [facility], for her to get a DEP at discharge to home tomorrow.Breast care reviewed with mom, and well as some pages in the NICU breastfeeding booklet. Mom doing skin to skin with her baby in the nICU, and then going back to her room to pump. Mom knows to call for questions/conerns   Maternal Data    Feeding Feeding Type: Donor Breast Milk Length of feed: 15 min  LATCH Score/Interventions       Type of Nipple: Everted at rest and after stimulation  Comfort (Breast/Nipple): Soft / non-tender           Lactation Tools Discussed/Used WIC Program:  ([facility] - to be discharged to hoeme on 1/30)   Consult Status Consult Status: Follow-up Date: 07/17/16 Follow-up type: In-patient    Alfred LevinsLee, Charlett Merkle Anne 07/16/2016, 11:26 AM

## 2016-07-16 NOTE — Progress Notes (Signed)
POD #3 LTCS for severe preeclampsia with FHR decels Feels ok, occasional headache Afeb, VSS, BP 130-160/80-100 Abd- soft, fundus firm, incision intact BP still labile.  Since Labetalol is not that effective for BP control, will increase Procardia to 90 mg daily and try to back off on Labetalol, continue ambulation

## 2016-07-17 ENCOUNTER — Encounter (HOSPITAL_COMMUNITY): Payer: Self-pay

## 2016-07-17 NOTE — Progress Notes (Signed)
Patient ID: Virginia Singleton, female   DOB: April 10, 1986, 31 y.o.   MRN: [id number] Pt doing well. Still no headaches or blurry vision.  VSS - 137/94 at 1451 ABD - soft, ND; incision c/d/i EXT - no Homans  A/P: POD # 4 s/p pLTCS for severe preE and FHR decel          Has been stable on regimen of labetalol and procardia today; will give next dose labetalol at 10pm   If stable overnight will likely be discharge to home in am

## 2016-07-17 NOTE — Lactation Note (Signed)
This note was copied from a baby's chart. Lactation Consultation Note  Patient Name: Virginia Singleton Today's Date: 07/17/2016  Mom is pumping every 3 hours and obtaining drops with hand expression.  Observed mom doing hand expression with good technique.  Reassured she should see an increase in milk supply in the next 24-48 hours.  Baby is stable.  Encouraged to call out for assist prn.  [facility]   Maternal Data    Feeding Feeding Type: Breast Milk (1mL breast milk with 1mL donor milk.) Length of feed: 30 min  LATCH Score/Interventions                      Lactation Tools Discussed/Used     Consult Status      Huston FoleyMOULDEN, Agastya Meister S 07/17/2016, 12:35 PM

## 2016-07-17 NOTE — Progress Notes (Signed)
Patient ID: Virginia NeedleChandelle Singleton, female   DOB: 1985-12-15, [age] y.o.   MRN: [id number] Pt off the floor to see baby

## 2016-07-17 NOTE — Progress Notes (Signed)
Patient ID: Casimiro NeedleChandelle Singleton, female   DOB: 1986-02-18, 31 y.o.   MRN: [id number] Late entry: Saw pt in nursery; sitting up and bonding well with baby. Has no complaints.  Denies any headaches, blurry vision or discomfort. Pain controlled with meds.  VS - 126-134/87-90 since last night; most recent 126/89  Physical exam deferred  A/P:POD#4 s/p pLTC/S for severe preE with FHR decel         BP appears to be stabilizing on current regimen of labetalol 300mg  bid and procardia 90xl         Will monitor to make sure stays stable today and no further adjustment needed; If change needed will try to wean off labetalol and manage on procardia xl only

## 2016-07-18 ENCOUNTER — Encounter (HOSPITAL_COMMUNITY): Payer: Self-pay | Admitting: *Deleted

## 2016-07-18 MED ORDER — NIFEDIPINE ER OSMOTIC RELEASE 90 MG PO TB24: 90.0000 mg | ORAL_TABLET | Freq: Every day | ORAL | 2 refills | Status: DC

## 2016-07-18 MED ORDER — LABETALOL HCL 300 MG PO TABS: 300.0000 mg | ORAL_TABLET | Freq: Two times a day (BID) | ORAL | 2 refills | Status: DC

## 2016-07-18 MED ORDER — IBUPROFEN 800 MG PO TABS: 800.0000 mg | ORAL_TABLET | Freq: Three times a day (TID) | ORAL | 1 refills | Status: DC | PRN

## 2016-07-18 MED ORDER — OXYCODONE-ACETAMINOPHEN 5-325 MG PO TABS: 1.0000 | ORAL_TABLET | Freq: Four times a day (QID) | ORAL | 0 refills | Status: DC | PRN

## 2016-07-18 MED ORDER — PRENATAL MULTIVITAMIN CH: 1.0000 | ORAL_TABLET | Freq: Every day | ORAL | 3 refills | Status: DC

## 2016-07-18 NOTE — Progress Notes (Signed)
Patient discharged home with family. Discharged paperwork, prescriptions, and follow-up appts reviewed. Signs/symptoms of preeclampsia reviewed and reasons to seek medical care discussed. No questions at this time.

## 2016-07-18 NOTE — Lactation Note (Signed)
This note was copied from a baby's chart. Lactation Consultation Note  Patient Name: Virginia Singleton JXBJY'NToday's Date: 07/18/2016 Reason for consult: Follow-up assessment  With this mom of a NICU baby, now 285 days old, 29 4/7 weeks CGA, and very small, weight 1 lb 13.6 oz. Mom is being discharged to home today, with her blood pressure controlled by 2 medications. Her milk is beginning to transition in. [facility], will continue to ump every 2-3 hours, and to add hand expression after each pumping. Mom knows to call for questions/conerns.    Maternal Data    Feeding Feeding Type: Donor Breast Milk Length of feed: 30 min  LATCH Score/Interventions             Problem noted: Filling        Lactation Tools Discussed/Used WIC Program:  (mom to get a DEP at wic today, after her discharge to home)   Consult Status Consult Status: PRN Follow-up type:  (NICU)    Virginia Singleton, Virginia Singleton 07/18/2016, 11:36 AM

## 2016-07-18 NOTE — Progress Notes (Signed)
Subjective: Postpartum Day 5: Cesarean Delivery Patient reports incisional pain and tolerating PO.    Objective: Vital signs in last 24 hours: Temp:  [98.3 F (36.8 C)-98.4 F (36.9 C)] 98.3 F (36.8 C) (01/31 0005) Pulse Rate:  [87-100] 87 (01/31 0005) Resp:  [18] 18 (01/31 0005) BP: (106-137)/(72-94) 135/87 (01/31 0005) SpO2:  [100 %] 100 % (01/30 1920)  Physical Exam:  General: alert and no distress Lochia: appropriate Uterine Fundus: firm Incision: healing well DVT Evaluation: No evidence of DVT seen on physical exam.  No results for input(s): HGB, HCT in the last 72 hours.  Assessment/Plan: Status post Cesarean section. Doing well postoperatively.  Discharge home with standard precautions and return to clinic in 2 weeks.  Will d/c with Procardia, Labetalol, motrin and Oxycodone.  Routine PP care.  Return to office for BP check /incision check in 2 weeks  Virginia Singleton, Virginia Singleton 07/18/2016, 8:46 AM

## 2016-07-18 NOTE — Discharge Summary (Signed)
OB Discharge Summary     Patient Name: Casimiro NeedleChandelle Rottmann DOB: [birth year] MRN: [id number]  Date of admission: 07/10/2016 Delivering MD: Huel CoteICHARDSON, KATHY   Date of discharge: 07/18/2016  Admitting diagnosis: 28WKS, HA,NAUSEA Intrauterine pregnancy: 7667w6d     Secondary diagnosis:  Active Problems:   Gestational hypertension   Preeclampsia, severe, third trimester  Additional problems: N/A     Discharge diagnosis: Preterm Pregnancy Delivered and PreEclampsia with severe features                                                                                                Post partum procedures:BP control  Augmentation: Cytotec  Complications: None  Hospital course:  Induction of Labor With Cesarean Section  31 y.o. yo G1P0100 at 3367w6d was admitted to the hospital 07/10/2016 for induction of labor. Patient had a labor course significant for PreE with severe features, poorly controlled BP. The patient went for cesarean section due to decels and remote from delivery, and delivered a Viable infant,@BABYSUPPRESS (DBLINK,ept,110,,1,,) Membrane Rupture Time/Date: )2:46 PM ,07/13/2016   @Details  of operation can be found in separate operative Note.  Patient had an uncomplicated postpartum course. She is ambulating, tolerating a regular diet, passing flatus, and urinating well.  Patient is discharged home in stable condition on 07/18/16.                                    Physical exam  Vitals:   07/17/16 0800 07/17/16 1451 07/17/16 1920 07/18/16 0005  BP: 126/89 (!) 137/94 106/72 135/87  Pulse: 90 89 100 87  Resp: 20  18 18   Temp: 98.4 F (36.9 C)  98.4 F (36.9 C) 98.3 F (36.8 C)  TempSrc: Oral  Oral Oral  SpO2: 100%  100%   Weight:      Height:       General: alert and no distress Lochia: appropriate Uterine Fundus: firm Incision: Healing well with no significant drainage DVT Evaluation: No evidence of DVT seen on physical exam. Labs: Lab Results  Component Value Date    WBC 15.4 (H) 07/14/2016   HGB 10.8 (L) 07/14/2016   HCT 31.4 (L) 07/14/2016   MCV 84.6 07/14/2016   PLT 193 07/14/2016   CMP Latest Ref Rng & Units 07/14/2016  Glucose 65 - 99 mg/dL 95  BUN 6 - 20 mg/dL 15  Creatinine 1.470.44 - 8.291.00 mg/dL 5.620.93  Sodium 130135 - 865145 mmol/L 133(L)  Potassium 3.5 - 5.1 mmol/L 4.7  Chloride 101 - 111 mmol/L 105  CO2 22 - 32 mmol/L 23  Calcium 8.9 - 10.3 mg/dL 7.8(I6.8(L)  Total Protein 6.5 - 8.1 g/dL 6.9(G5.4(L)  Total Bilirubin 0.3 - 1.2 mg/dL 0.3  Alkaline Phos 38 - 126 U/L 65  AST 15 - 41 U/L 28  ALT 14 - 54 U/L 21    Discharge instruction: per After Visit Summary and "Baby and Me Booklet".  After visit meds:  Allergies as of 07/18/2016      Reactions   Mushroom Extract Complex Hives, Itching  Zithromax [azithromycin] Hives, Rash      Medication List    STOP taking these medications   promethazine 25 MG tablet Commonly known as:  PHENERGAN     TAKE these medications   acetaminophen 325 MG tablet Commonly known as:  TYLENOL Take 650 mg by mouth every 6 (six) hours as needed for mild pain or headache.   ibuprofen 800 MG tablet Commonly known as:  ADVIL,MOTRIN Take 1 tablet (800 mg total) by mouth every 8 (eight) hours as needed.   labetalol 300 MG tablet Commonly known as:  NORMODYNE Take 1 tablet (300 mg total) by mouth 2 (two) times daily.   NIFEdipine 90 MG 24 hr tablet Commonly known as:  PROCARDIA XL/ADALAT-CC Take 1 tablet (90 mg total) by mouth daily. Start taking on:  07/19/2016   oxyCODONE-acetaminophen 5-325 MG tablet Commonly known as:  PERCOCET/ROXICET Take 1-2 tablets by mouth every 6 (six) hours as needed for moderate pain or severe pain.   prenatal multivitamin Tabs tablet Take 1 tablet by mouth daily at 12 noon.       Diet: routine diet  Activity: Advance as tolerated. Pelvic rest for 6 weeks.   Outpatient follow up:2 weeks Follow up Appt:No future appointments. Follow up Visit:No Follow-up on file.  Postpartum  contraception: Not Discussed  Newborn Data: Live born female  Birth Weight: 1 lb 15 oz (880 g) APGAR: 7, 8  Baby Feeding: Breast Disposition:NICU   07/18/2016 Sherian Rein, MD

## 2016-07-19 NOTE — Progress Notes (Signed)
CLINICAL SOCIAL WORK MATERNAL/CHILD NOTE  Patient Details  Name: Girl Ruqaya Strauss MRN: [id number] Date of Birth: 07/13/2015  Date:  07/18/2016  Clinical Social Worker Initiating Note:  Terri Piedra, [place] Date/ Time Initiated:  07/18/16/0910     Child's Name:  Saintclair Halsted   Legal Guardian:  Other (Comment) (Parents: Tillman Abide and Maureen Ralphs)   Need for Interpreter:  None   Date of Referral:   (No referral-NICU admission)     Reason for Referral:      Referral Source:      Address:     Phone number:      Household Members:  Significant Other   Natural Supports (not living in the home):  Friends, Immediate Family, Extended Family (MOB reports that FOB is her main support and that his family lives locally and are involved and supportive.  MOB states that her mother lives in [place] and was here for a few days.  She states her father and stepmom live in [place].)   Professional Supports: None   Employment:     Type of Work: MOB works at [employer].  FOB is a English as a second language teacher and is self-employed.   Education:      Museum/gallery curator Resources:  Medicaid   Other Resources:      Cultural/Religious Considerations Which May Impact Care: None stated.  MOB's facesheet notes religion as [place].  Strengths:  Ability to meet basic needs , Compliance with medical plan , Understanding of illness   Risk Factors/Current Problems:  None   Cognitive State:  Alert , Able to Concentrate , Insightful , Linear Thinking , Goal Oriented    Mood/Affect:  Comfortable , Calm , Interested    CSW Assessment: CSW met with MOB in her third floor room/303 to introduce services, offer support, and complete assessment due to baby's admission to NICU at 28.6 weeks.  MOB was pumping, but stated she felt comfortable talking with CSW at this time.  She was pleasant, welcoming, and easy to engage.  She was tearful at times, but overall seemed to be in good spirits. MOB informed CSW that this  pregnancy was not planned, but that she and FOB are excited about the baby.  She states she was in shock when she found out she was pregnant and that FOB had already told her that he would like more children.  This is her first baby and FOB's 4th.  She reports that they live together and have a positive relationship.  They have been together approximately 1.5 years.  She states they have a good support system. MOB shared her birth story with CSW and acknowledges sadness that her daughter came early.  She states she feels it is going to be extremely difficult emotionally to leave the hospital today.  CSW normalized, validated and provided supportive brief counseling as MOB talked about her emotions.   CSW provided education regarding PMADs and asked that MOB monitor her emotions.  CSW encouraged MOB to allow herself to be emotional, but let CSW and or her [place] know if negative emotions begin to interfere with daily life or her ability to find joy in this time.  MOB stated understanding and agreement.  She reports no hx of mental illness. CSW encouraged MOB to focus on her baby, rather than baby's surroundings.  CSW asked MOB not to place expectations on when baby will be ready for discharge, as every baby is different and only baby can determine when she is ready.  MOB acknowledges that  she cannot care for baby at this point.  CSW offered suggestions on things only she can do as a parent even though she is not providing primary care at this time.  MOB seemed to appreciate this.   CSW explained baby's eligibility for SSI and explained how to start the application process through the [employer].  MOB stated understanding that the hospital is not involved in the approval process.  MOB is interested in applying and therefore, CSW obtained her signature on a Patient Access form and provided her with a copy of baby's admission summary.   MOB states appreciation for the care her daughter is receiving  and feels well updated by staff.  CSW asked her to call if she would like to schedule a family conference at any time during baby's hospitalization.  MOB thanked CSW. CSW explained ongoing support services offered by NICU CSW and gave contact information.  CSW Plan/Description:  Information/Referral to [employer] , Engineer, mining , Psychosocial Support and Ongoing Assessment of Needs    Alphonzo Cruise, [place] 07/18/2016, 10:00 AM

## 2016-07-25 ENCOUNTER — Ambulatory Visit: Payer: Self-pay

## 2016-07-25 NOTE — Lactation Note (Signed)
This note was copied from a baby's chart. Lactation Consultation Note  Patient Name: Virginia Singleton WUJWJ'XToday's Date: 07/25/2016 Reason for consult: Follow-up assessment   With this mom of a NICU baby, now 5212 days old, 30 4/7 weeks CGA, and 2 lbs 1.9 oz. Mom asked that I observe a pumping. Mom is pumping at least 8 times a day, followed by hand expression, and is expressing as little as 1 ml at a time. I advised her to add 1 power pumping a day, and to call her OB, and ask about reglan, and if using Mother's love products would be ok for mom. I also gave mom permission to stop pumping, if her supply continues to be so slow. Virginia Singleton will get donor EBm while she is little. Mom sad but doing well.    Maternal Data    Feeding    LATCH Score/Interventions                      Lactation Tools Discussed/Used     Consult Status      Alfred LevinsLee, Rowen Wilmer Anne 07/25/2016, 4:42 PM

## 2016-08-20 ENCOUNTER — Ambulatory Visit: Payer: Self-pay

## 2016-08-20 NOTE — Lactation Note (Signed)
This note was copied from a baby's chart. Lactation Consultation Note  Patient Name: Virginia Singleton: 08/20/2016 Reason for consult: Follow-up assessment;NICU baby;Infant < 6lbs   Spoke with mother at infant's bedside. Mom reports she gets about 3-5 cc with each pumping. She is taking Fenugreek, Lactation Cookies and Reglan within the last few weeks and is not noticing an increase. Mom is pumping every 3 hours during day and every 4 at night. Enc power pumping at last pumping in the evening. Mom reports she gets more colostrum from hand expression, enc her to continue hand expressing post pumping. Mom is to return to work in 2 weeks and feels she will be able to pump at work. Praised mom for her efforts and enc her not to stress too much about pumping.    Maternal Data Has patient been taught Hand Expression?: Yes  Feeding Feeding Type: Donor Breast Milk Length of feed: 120 min  LATCH Score/Interventions                      Lactation Tools Discussed/Used     Consult Status Consult Status: PRN Follow-up type: Call as needed    Ed BlalockSharon S Xaidyn Kepner 08/20/2016, 10:52 AM

## 2016-08-22 ENCOUNTER — Ambulatory Visit: Payer: Self-pay

## 2016-08-22 NOTE — Lactation Note (Signed)
This note was copied from a baby's chart. Lactation Consultation Note  Patient Name: Virginia Singleton: 08/22/2016 Reason for consult: Follow-up assessment   With this mom of a NICU baby, Arna Mediciora, now 535 weeks old, and now 7034 4/7 weeks CGA. She bottle fed for the first time today. Mom is just pumping drops of milk. She did try reglan, but since she stopped it, her supply is all but gone. She was taking fenugreek once a day. I advised mom to take 3 tablets 3 times a day - mom was going to increase her dose. Mom wants to continue pumping until the baby is switched to formula. Mom knows to call for questions/conerns.    Maternal Data    Feeding Feeding Type: Donor Breast Milk Length of feed: 60 min  LATCH Score/Interventions                      Lactation Tools Discussed/Used     Consult Status Consult Status: PRN Follow-up type: In-patient (NICU)    Virginia Singleton, Virginia Singleton 08/22/2016, 3:23 PM

## 2016-08-23 ENCOUNTER — Ambulatory Visit: Payer: Self-pay

## 2016-08-23 NOTE — Lactation Note (Signed)
This note was copied from a baby's chart. Lactation Consultation Note  Patient Name: Virginia Singleton Today's Date: 08/23/2016   NICU baby 555 weeks old. Mom reports that she is pumping but getting only drops now. Mom reports that the color of the EBM is now lighter to clear. Asked mom about her menstrual cycle, and mom reports that she did just have her first cycle since the baby's birth. Praised mom for all her effort and discussed how every drop of EBM is beneficial to the baby. Mom stated that she feels sad that she never produced much milk, but happy that she has been able to produce some breast milk. Enc mom to continue pumping as long as she wishes. Refitted mom with a #30 flange and enc mom to use coconut oil.   Maternal Data    Feeding Feeding Type: Donor Breast Milk Nipple Type: Slow - flow Length of feed: 30 min  LATCH Score/Interventions                      Lactation Tools Discussed/Used     Consult Status      Sherlyn HayJennifer D Ammiel Guiney 08/23/2016, 5:15 PM

## 2016-12-28 NOTE — Addendum Note (Signed)
Addendum  created 12/28/16 1435 by Cristela BlueJackson, Sylvio Weatherall, MD   Sign clinical note

## 2016-12-28 NOTE — Anesthesia Postprocedure Evaluation (Signed)
Anesthesia Post Note  Patient: Virginia Singleton  Procedure(s) Performed: Procedure(s) (LRB): CESAREAN SECTION (N/A)     Anesthesia Post Evaluation  Last Vitals:  Vitals:   07/18/16 0005 07/18/16 1243  BP: 135/87 126/89  Pulse: 87 93  Resp: 18   Temp: 36.8 C     Last Pain:  Vitals:   07/18/16 0932  TempSrc:   PainSc: 0-No pain                 Tyshea Imel EDWARD

## 2017-01-19 ENCOUNTER — Encounter (HOSPITAL_COMMUNITY): Payer: Self-pay | Admitting: *Deleted

## 2017-01-19 ENCOUNTER — Emergency Department (HOSPITAL_COMMUNITY)
Admission: EM | Admit: 2017-01-19 | Discharge: 2017-01-19 | Disposition: A | Discharge status: Home / Self Care | Payer: Medicaid Other | Attending: Emergency Medicine | Admitting: Emergency Medicine

## 2017-01-19 ENCOUNTER — Emergency Department (HOSPITAL_COMMUNITY): Payer: Medicaid Other

## 2017-01-19 DIAGNOSIS — G43009 Migraine without aura, not intractable, without status migrainosus: Secondary | ICD-10-CM | POA: Diagnosis not present

## 2017-01-19 DIAGNOSIS — R1110 Vomiting, unspecified: Secondary | ICD-10-CM

## 2017-01-19 DIAGNOSIS — I10 Essential (primary) hypertension: Secondary | ICD-10-CM | POA: Diagnosis not present

## 2017-01-19 DIAGNOSIS — Z79899 Other long term (current) drug therapy: Secondary | ICD-10-CM | POA: Diagnosis not present

## 2017-01-19 DIAGNOSIS — M549 Dorsalgia, unspecified: Secondary | ICD-10-CM | POA: Insufficient documentation

## 2017-01-19 DIAGNOSIS — R51 Headache: Secondary | ICD-10-CM | POA: Diagnosis present

## 2017-01-19 LAB — CBC WITH DIFFERENTIAL/PLATELET
Basophils Absolute: 0 10*3/uL (ref 0.0–0.1)
Basophils Relative: 0 %
Eosinophils Absolute: 0.1 10*3/uL (ref 0.0–0.7)
Eosinophils Relative: 1 %
HEMATOCRIT: 41.2 % (ref 36.0–46.0)
Hemoglobin: 14 g/dL (ref 12.0–15.0)
LYMPHS PCT: 12 %
Lymphs Abs: 1.2 10*3/uL (ref 0.7–4.0)
MCH: 27 pg (ref 26.0–34.0)
MCHC: 34 g/dL (ref 30.0–36.0)
MCV: 79.4 fL (ref 78.0–100.0)
MONO ABS: 0.3 10*3/uL (ref 0.1–1.0)
MONOS PCT: 3 %
NEUTROS ABS: 8.5 10*3/uL — AB (ref 1.7–7.7)
Neutrophils Relative %: 84 %
Platelets: 278 10*3/uL (ref 150–400)
RBC: 5.19 MIL/uL — ABNORMAL HIGH (ref 3.87–5.11)
RDW: 15.5 % (ref 11.5–15.5)
WBC: 10.1 10*3/uL (ref 4.0–10.5)

## 2017-01-19 LAB — URINALYSIS, ROUTINE W REFLEX MICROSCOPIC
BILIRUBIN URINE: NEGATIVE
Glucose, UA: NEGATIVE mg/dL
Hgb urine dipstick: NEGATIVE
Ketones, ur: 5 mg/dL — AB
Leukocytes, UA: NEGATIVE
Nitrite: NEGATIVE
Protein, ur: NEGATIVE mg/dL
RBC / HPF: NONE SEEN RBC/hpf (ref 0–5)
SPECIFIC GRAVITY, URINE: 1.016 (ref 1.005–1.030)
pH: 7 (ref 5.0–8.0)

## 2017-01-19 LAB — COMPREHENSIVE METABOLIC PANEL
ALBUMIN: 3.7 g/dL (ref 3.5–5.0)
ALT: 14 U/L (ref 14–54)
ANION GAP: 11 (ref 5–15)
AST: 17 U/L (ref 15–41)
Alkaline Phosphatase: 72 U/L (ref 38–126)
BUN: 9 mg/dL (ref 6–20)
CALCIUM: 9.1 mg/dL (ref 8.9–10.3)
CHLORIDE: 105 mmol/L (ref 101–111)
CO2: 20 mmol/L — AB (ref 22–32)
Creatinine, Ser: 0.95 mg/dL (ref 0.44–1.00)
GFR calc Af Amer: >60 mL/min (ref >60)
GFR calc non Af Amer: >60 mL/min (ref >60)
GLUCOSE: 115 mg/dL — AB (ref 65–99)
POTASSIUM: 4.2 mmol/L (ref 3.5–5.1)
Sodium: 136 mmol/L (ref 135–145)
Total Bilirubin: 0.8 mg/dL (ref 0.3–1.2)
Total Protein: 7.6 g/dL (ref 6.5–8.1)

## 2017-01-19 LAB — LIPASE, BLOOD: Lipase: 29 U/L (ref 11–51)

## 2017-01-19 LAB — I-STAT BETA HCG BLOOD, ED (MC, WL, AP ONLY)

## 2017-01-19 MED ORDER — LABETALOL HCL 100 MG PO TABS: 100.0000 mg | ORAL_TABLET | Freq: Two times a day (BID) | ORAL | 0 refills | Status: DC

## 2017-01-19 MED ORDER — ONDANSETRON HCL 4 MG/2ML IJ SOLN
4.0000 mg | Freq: Once | INTRAMUSCULAR | Status: AC
  Administered 2017-01-19: 4 mg via INTRAVENOUS
  Filled 2017-01-19: qty 2

## 2017-01-19 MED ORDER — FENTANYL CITRATE (PF) 100 MCG/2ML IJ SOLN
50.0000 ug | Freq: Once | INTRAMUSCULAR | Status: AC
  Administered 2017-01-19: 50 ug via INTRAVENOUS
  Filled 2017-01-19: qty 2

## 2017-01-19 MED ORDER — DIPHENHYDRAMINE HCL 50 MG/ML IJ SOLN
25.0000 mg | Freq: Once | INTRAMUSCULAR | Status: AC
  Administered 2017-01-19: 25 mg via INTRAVENOUS
  Filled 2017-01-19: qty 1

## 2017-01-19 MED ORDER — SODIUM CHLORIDE 0.9 % IV BOLUS (SEPSIS)
1000.0000 mL | Freq: Once | INTRAVENOUS | Status: AC
  Administered 2017-01-19: 1000 mL via INTRAVENOUS

## 2017-01-19 MED ORDER — METOCLOPRAMIDE HCL 5 MG/ML IJ SOLN
10.0000 mg | Freq: Once | INTRAMUSCULAR | Status: AC
  Administered 2017-01-19: 10 mg via INTRAVENOUS
  Filled 2017-01-19: qty 2

## 2017-01-19 MED ORDER — CYCLOBENZAPRINE HCL 5 MG PO TABS: 5.0000 mg | ORAL_TABLET | Freq: Three times a day (TID) | ORAL | 0 refills | Status: DC | PRN

## 2017-01-19 NOTE — ED Triage Notes (Signed)
The pt is c/o a headache and back pain all day with nausea and vomiting  lmp one month  Hx of headaches

## 2017-01-19 NOTE — ED Notes (Signed)
Took patient saline lock out patient is getting ready to be discharge

## 2017-01-19 NOTE — ED Provider Notes (Signed)
[facility]EM[facility]ENCY DEPT Provider Note   CSN: [id number] Arrival date & time: 01/19/17  0004     History   Chief Complaint Chief Complaint  Patient presents with  . Headache  . Back Pain    HPI Virginia Singleton is a 31 y.o. female.  The history is provided by the patient.  Headache   This is a new problem. The current episode started 12 to 24 hours ago. The problem occurs constantly. The problem has not changed since onset.The pain is moderate. The pain does not radiate. Associated symptoms include malaise/fatigue and shortness of breath. Pertinent negatives include no fever.  Back Pain   This is a new problem. The current episode started 12 to 24 hours ago. The problem occurs constantly. The problem has not changed since onset.The pain is associated with no known injury. The pain is moderate. The symptoms are aggravated by bending and twisting. Associated symptoms include headaches. Pertinent negatives include no fever, no abdominal pain, no bowel incontinence, no bladder incontinence, no dysuria and no weakness. Associated symptoms comments: Chest tightness .    Past Medical History:  Diagnosis Date  . Hypercholesteremia   . Migraines     Patient Active Problem List   Diagnosis Date Noted  . Preeclampsia, severe, third trimester 07/13/2016  . Gestational hypertension 07/10/2016    Past Surgical History:  Procedure Laterality Date  . CESAREAN SECTION N/A 07/13/2016   Procedure: CESAREAN SECTION;  Surgeon: Huel CoteKathy Richardson, [place];  [facility];  Service: Obstetrics;  Laterality: N/A;  . WISDOM TOOTH EXTRACTION      OB History    Gravida Para Term Preterm AB Living   1 1 0 1 0 0   SAB TAB Ectopic Multiple Live Births   0 0 0 0         Home Medications    Prior to Admission medications   Medication Sig Start Date End Date Taking? Authorizing Provider  acetaminophen (TYLENOL) 325 MG tablet Take 650 mg by mouth every 6 (six) hours as needed for mild  pain or headache.    [provider]  ibuprofen (ADVIL,MOTRIN) 800 MG tablet Take 1 tablet (800 mg total) by mouth every 8 (eight) hours as needed. 07/18/16   Bovard-Stuckert, Augusto GambleJody, MD  labetalol (NORMODYNE) 300 MG tablet Take 1 tablet (300 mg total) by mouth 2 (two) times daily. 07/18/16   Bovard-Stuckert, Augusto GambleJody, MD  NIFEdipine (PROCARDIA XL/ADALAT-CC) 90 MG 24 hr tablet Take 1 tablet (90 mg total) by mouth daily. 07/19/16   Bovard-Stuckert, Augusto GambleJody, MD  oxyCODONE-acetaminophen (PERCOCET/ROXICET) 5-325 MG tablet Take 1-2 tablets by mouth every 6 (six) hours as needed for moderate pain or severe pain. 07/18/16   Bovard-Stuckert, Augusto GambleJody, MD  Prenatal Vit-Fe Fumarate-FA (PRENATAL MULTIVITAMIN) TABS tablet Take 1 tablet by mouth daily at 12 noon. 07/18/16   Bovard-StuckertAugusto Gamble, Jody, MD    Family History No family history on file.  Social History Social History  Substance Use Topics  . Smoking status: Never Smoker  . Smokeless tobacco: Never Used  . Alcohol use Yes     Comment: occas.     Allergies   Mushroom extract complex and Zithromax [azithromycin]   Review of Systems Review of Systems  Constitutional: Positive for fatigue and malaise/fatigue. Negative for fever.  Respiratory: Positive for shortness of breath.   Cardiovascular:       Chest tightness   Gastrointestinal: Negative for abdominal pain and bowel incontinence.  Genitourinary: Negative for bladder incontinence, dysuria, vaginal bleeding and vaginal  discharge.  Musculoskeletal: Positive for back pain.  Neurological: Positive for headaches. Negative for weakness.  All other systems reviewed and are negative.    Physical Exam Updated Vital Signs BP (!) 159/115 (BP Location: Left Arm)   Pulse 90   Temp 98.5 F (36.9 C) (Oral)   Resp (!) 22   Ht 1.676 m (5\' 6" )   Wt 108.9 kg (240 lb)   LMP 12/19/2016   SpO2 100%   BMI 38.74 kg/m   Physical Exam CONSTITUTIONAL: Well developed/well nourished HEAD:  Normocephalic/atraumatic EYES: EOMI/PERRL ENMT: Mucous membranes moist NECK: supple no meningeal signs SPINE/BACK:diffuse spinal and paraspinal tenderness CV: S1/S2 noted, no murmurs/rubs/gallops noted LUNGS: Lungs are clear to auscultation bilaterally, no apparent distress ABDOMEN: soft, nontender, no rebound or guarding GU:no cva tenderness NEURO: Awake/alert,equal motor 5/5 strength noted with the following: hip flexion/knee flexion/extension, foot dorsi/plantar flexion, great toe extension intact bilaterally, no clonus bilaterally, plantar reflex appropriate (toes downgoing), no sensory deficit in any dermatome.  Equal patellar/achilles reflex noted (2+) in bilateral lower extremities. No arm/leg drift.  No facial droop EXTREMITIES: pulses normal, full ROM SKIN: warm, color normal PSYCH: no abnormalities of mood noted, alert and oriented to situation    ED Treatments / Results  Labs (all labs ordered are listed, but only abnormal results are displayed) Labs Reviewed  COMPREHENSIVE METABOLIC PANEL - Abnormal; Notable for the following:       Result Value   CO2 20 (*)    Glucose, Bld 115 (*)    All other components within normal limits  CBC WITH DIFFERENTIAL/PLATELET - Abnormal; Notable for the following:    RBC 5.19 (*)    Neutro Abs 8.5 (*)    All other components within normal limits  URINALYSIS, ROUTINE W REFLEX MICROSCOPIC - Abnormal; Notable for the following:    APPearance CLOUDY (*)    Ketones, ur 5 (*)    Bacteria, UA RARE (*)    Squamous Epithelial / LPF 6-30 (*)    All other components within normal limits  LIPASE, BLOOD  I-STAT BETA HCG BLOOD, ED ([facility], [facility], [facility] ONLY)    EKG  EKG Interpretation  Date/Time:  Saturday January 19 2017 01:51:56 EDT Ventricular Rate:  82 PR Interval:    QRS Duration: 88 QT Interval:  390 QTC Calculation: 456 R Axis:   42 Text Interpretation:  Sinus rhythm Borderline short PR interval Interpretation limited secondary to artifact  Confirmed by Zadie Rhine ([id number]) on 01/19/2017 1:56:24 AM       Radiology Dg Chest Port 1 View  Result Date: 01/19/2017 CLINICAL DATA:  Acute onset of generalized chest pain and back pain. Vomiting. Initial encounter. EXAM: PORTABLE CHEST 1 VIEW COMPARISON:  None. FINDINGS: The lungs are well-aerated and clear. There is no evidence of focal opacification, pleural effusion or pneumothorax. The cardiomediastinal silhouette is within normal limits. No acute osseous abnormalities are seen. IMPRESSION: No acute cardiopulmonary process seen. Electronically Signed   By: Roanna Raider M.D.   On: 01/19/2017 05:09    Procedures Procedures (including critical care time)  Medications Ordered in ED Medications  metoCLOPramide (REGLAN) injection 10 mg (10 mg Intravenous Given 01/19/17 0403)  diphenhydrAMINE (BENADRYL) injection 25 mg (25 mg Intravenous Given 01/19/17 0358)  sodium chloride 0.9 % bolus 1,000 mL (0 mLs Intravenous Stopped 01/19/17 0613)  fentaNYL (SUBLIMAZE) injection 50 mcg (50 mcg Intravenous Given 01/19/17 0527)  ondansetron (ZOFRAN) injection 4 mg (4 mg Intravenous Given 01/19/17 0525)     Initial Impression /  Assessment and Plan / ED Course  I have reviewed the triage vital signs and the nursing notes.  Pertinent labs & imaging results that were available during my care of the patient were reviewed by me and considered in my medical decision making (see chart for details).     2:46 AM Pt presents with HA/back pain/chest tightness/sob Her main complaints are back pain/HA No focal neuro deficits She has h/o HTN but untreated, not taking home meds Previous ho pre-eclampsia with last pregnancy Workup pending 6:17 AM Pt poor historian, had multiple complaints on arrival, but now reports persistent HA/nausea upon sitting up/standing She is not responding to meds She has no neuro deficits but will proceed with Ct imaging of head 7:20 AM At signout to dr Silverio Layyao, f/u on CT head If  negative d/c home and she will need to restart labetalol  Final Clinical Impressions(s) / ED Diagnoses   Final diagnoses:  None    New Prescriptions New Prescriptions   No medications on file     Zadie RhineWickline, Kayliah Tindol, MD 01/19/17 [phone]

## 2017-01-19 NOTE — ED Notes (Signed)
GOTTEN PT.UP ON SIDE OF THE BED the patient.COMPLAINS OF HEADACHE ON RIGHT TEMP AREA .SHEWAS ABLE TO WALK OUTSIDE OF TO THE HALLWAY.AND BACK TO THE BED. DR.WICKLINE IS AWEAR.

## 2017-01-19 NOTE — ED Notes (Signed)
Patient transported to X-ray 

## 2017-01-19 NOTE — ED Notes (Signed)
IV team bedside. 

## 2017-01-19 NOTE — ED Notes (Signed)
Declined W/C at D/C and was escorted to lobby by RN. 

## 2017-01-19 NOTE — ED Provider Notes (Signed)
  Physical Exam  BP 132/80   Pulse 75   Temp 98.5 F (36.9 C) (Oral)   Resp 12   Ht 5\' 6"  (1.676 m)   Wt 108.9 kg (240 lb)   LMP 12/19/2016   SpO2 100%   BMI 38.74 kg/m   Physical Exam  ED Course  Procedures  MDM Care assumed at sign out. Patient here with back pain, headache. Labs unremarkable. Patient ran out of labetalol. BP improved in the ED. Sign out pending CT head. CT unremarkable. Headache improved with migraine cocktail. Will dc home with flexeril for back strain, refill labetalol.        Charlynne PanderYao, David Hsienta, MD 01/19/17 [phone]

## 2017-01-19 NOTE — ED Notes (Signed)
Pt is ask to ambulate. Pt states she will try. Pt is set up from lying position and she starts complaining of a headache coming back. Pt states every time she sets up she vomits. Pt also states she is afraid to ambulates with the headache she will vomit. States she will try later. RN Victorino DikeJennifer is informed and Dr Bebe ShaggyWickline is informed.

## 2017-01-19 NOTE — Discharge Instructions (Signed)
Take motrin, tylenol for back pain or headaches.   Your blood pressure is elevated. Take labetalol 100 mg twice daily.   See your doctor in a week to recheck blood pressure   Return to ER if you have worse headache, back pain, numbness, weakness

## 2017-01-19 NOTE — ED Notes (Signed)
Patient transported to Ultrasound 

## 2020-06-18 NOTE — L&D Delivery Note (Signed)
Delivery Note Pt was comfortable with epidural. No complaints. On exam, fetal parts noted.  Pt made to push for and at 6:34 PM a non-viable female was delivered via Vaginal, Spontaneous (Presentation: transverse  ).  APGAR: 0, 0; weight pending  Nuchal x 2 was noted . Per pt request baby taken to cot first.  Segment of cord was taken for anora testing as agreed.   Pt had minimal vaginal bleeding  Attempted pushing again but minimal descent noted for baby B.  Pt will be allowed to continue to labor for now and to spend time with baby A.   Pitocin at .   After 3 hrs, pt was able to push for to deliver baby B.   Bleeding still minimal.  Both placentas in place despite pitocin and pushing intermittently 2 hrs later  Plan to take pt to OR to evacuate placenta    Placenta status: undelivered ;   Anesthesia: Epidural Episiotomy: None Lacerations: None Suture Repair: n/a Est. Blood Loss (mL):  <279ml  Mom to OR.  Babies to [place].  Cathrine Muster 10/18/2020, 7:05 PM

## 2020-06-25 ENCOUNTER — Encounter: Payer: Self-pay | Admitting: Obstetrics and Gynecology

## 2020-08-05 ENCOUNTER — Other Ambulatory Visit: Payer: Self-pay | Admitting: Obstetrics and Gynecology

## 2020-09-27 ENCOUNTER — Encounter (HOSPITAL_COMMUNITY): Payer: Self-pay | Admitting: Obstetrics and Gynecology

## 2020-09-27 ENCOUNTER — Other Ambulatory Visit: Payer: Self-pay

## 2020-09-27 ENCOUNTER — Ambulatory Visit: Payer: Medicaid Other

## 2020-09-27 ENCOUNTER — Encounter: Payer: Self-pay | Admitting: Obstetrics and Gynecology

## 2020-09-27 ENCOUNTER — Inpatient Hospital Stay (HOSPITAL_COMMUNITY)
Admission: AD | Admit: 2020-09-27 | Discharge: 2020-09-27 | Disposition: A | Discharge status: Home / Self Care | Payer: Medicaid Other | Attending: Obstetrics and Gynecology | Admitting: Obstetrics and Gynecology

## 2020-09-27 DIAGNOSIS — O30042 Twin pregnancy, dichorionic/diamniotic, second trimester: Secondary | ICD-10-CM | POA: Diagnosis not present

## 2020-09-27 DIAGNOSIS — Z3689 Encounter for other specified antenatal screening: Secondary | ICD-10-CM | POA: Diagnosis not present

## 2020-09-27 DIAGNOSIS — O26892 Other specified pregnancy related conditions, second trimester: Secondary | ICD-10-CM | POA: Diagnosis not present

## 2020-09-27 DIAGNOSIS — R030 Elevated blood-pressure reading, without diagnosis of hypertension: Secondary | ICD-10-CM | POA: Diagnosis present

## 2020-09-27 DIAGNOSIS — O3113X2 Continuing pregnancy after spontaneous abortion of one fetus or more, third trimester, fetus 2: Secondary | ICD-10-CM | POA: Diagnosis not present

## 2020-09-27 DIAGNOSIS — Z3A22 22 weeks gestation of pregnancy: Secondary | ICD-10-CM | POA: Diagnosis not present

## 2020-09-27 DIAGNOSIS — O364XX1 Maternal care for intrauterine death, fetus 1: Secondary | ICD-10-CM | POA: Insufficient documentation

## 2020-09-27 DIAGNOSIS — O365932 Maternal care for other known or suspected poor fetal growth, third trimester, fetus 2: Secondary | ICD-10-CM | POA: Diagnosis not present

## 2020-09-27 DIAGNOSIS — R102 Pelvic and perineal pain: Secondary | ICD-10-CM | POA: Diagnosis not present

## 2020-09-27 DIAGNOSIS — O3122X2 Continuing pregnancy after intrauterine death of one fetus or more, second trimester, fetus 2: Secondary | ICD-10-CM | POA: Diagnosis not present

## 2020-09-27 DIAGNOSIS — O365920 Maternal care for other known or suspected poor fetal growth, second trimester, not applicable or unspecified: Secondary | ICD-10-CM | POA: Insufficient documentation

## 2020-09-27 MED ORDER — BETAMETHASONE SOD PHOS & ACET 6 (3-3) MG/ML IJ SUSP: 12.0000 mg | INTRAMUSCULAR | Status: DC

## 2020-09-27 MED ORDER — BETAMETHASONE SOD PHOS & ACET 6 (3-3) MG/ML IJ SUSP
12.0000 mg | Freq: Once | INTRAMUSCULAR | Status: AC
  Administered 2020-09-27: 12 mg via INTRAMUSCULAR
  Filled 2020-09-27: qty 5

## 2020-09-27 NOTE — MAU Note (Signed)
Verbal orders for bethamethasone given to this RN from Dr Mindi Slicker. Entered orders for today and tomorrow.

## 2020-09-27 NOTE — MAU Note (Signed)
Virginia Singleton is a 35 y.o. at [redacted]w[redacted]d here in MAU reporting: was sent over from [facility] after anatomy scan and they only found a heartbeat for 1 baby. Was sent over for another u/s and BMZ injection. No abdominal pain or bleeding. States she does have some back pain but this is not a new issue.   Onset of complaint: today  Pain score: 5/10  Vitals:   09/27/20 1251  BP: (!) 140/103  Pulse: 71  Resp: 16  Temp: 98.4 F (36.9 C)  SpO2: 100%     FHT:150  Lab orders placed from triage: none

## 2020-09-27 NOTE — MAU Note (Signed)
Spoke with Dr Mindi Slicker regarding pt arrival. Dr Mindi Slicker will enter orders for BMZ and pt is to have a follow up u/s tomorrow with MFM. Dr Mindi Slicker made aware of BP, no new orders received and states pt will have it checked tomorrow at [facility].

## 2020-09-27 NOTE — MAU Provider Note (Signed)
Event Date/Time  First Provider Initiated Contact with Patient 09/27/20 1316     S Ms. Ivie Savitt is a 35 y.o. G2P0100 patient who presents to MAU from [facility] office for administration of BMZ #1. She was diagnosed with absent fetal heart tones for one of her di/di twins and IUGR for the surviving twin. She is tearful but denies pain, bleeding.  Patient denies history of HTN. She denies headache, visual disturbances, RUQ/epigastric pain.  O BP (!) 144/102 (BP Location: Right Arm)   Pulse 71   Temp 98.4 F (36.9 C) (Oral)   Resp 16   Ht 5\' 6"  (1.676 m)   Wt 104.2 kg   SpO2 100% Comment: room air  BMI 37.09 kg/m    Physical Exam Vitals and nursing note reviewed. Exam conducted with a chaperone present.  Cardiovascular:     Rate and Rhythm: Normal rate.     Pulses: Normal pulses.  Pulmonary:     Effort: Pulmonary effort is normal.  Skin:    Capillary Refill: Capillary refill takes less than 2 seconds.  Neurological:     Mental Status: She is alert and oriented to person, place, and time.  Psychiatric:        Mood and Affect: Mood normal.        Behavior: Behavior normal.        Thought Content: Thought content normal.        Judgment: Judgment normal.    A Medical screening exam complete MSE only. All aspects of patient care managed by Dr. and Charge RN See separate note regarding report of elevated BPs, acknowledgment by Dr. Mindi Slicker Discharge from MAU in stable condition Patient may return to MAU as needed   F/U: Return to MAU in 24 hours for BMZ 2 of 2  Alfonse Ras, Calvert Cantor 09/27/2020 2:18 PM

## 2020-09-28 ENCOUNTER — Other Ambulatory Visit: Payer: Self-pay | Admitting: *Deleted

## 2020-09-28 ENCOUNTER — Ambulatory Visit: Payer: Medicaid Other | Attending: Maternal & Fetal Medicine | Admitting: Maternal & Fetal Medicine

## 2020-09-28 ENCOUNTER — Ambulatory Visit: Payer: Medicaid Other | Admitting: *Deleted

## 2020-09-28 ENCOUNTER — Ambulatory Visit: Payer: Medicaid Other

## 2020-09-28 ENCOUNTER — Encounter: Payer: Self-pay | Admitting: *Deleted

## 2020-09-28 ENCOUNTER — Encounter: Payer: Self-pay | Admitting: Obstetrics and Gynecology

## 2020-09-28 ENCOUNTER — Inpatient Hospital Stay (HOSPITAL_COMMUNITY)
Admission: AD | Admit: 2020-09-28 | Discharge: 2020-09-28 | Disposition: A | Discharge status: Home / Self Care | Payer: Medicaid Other | Source: Home / Self Care | Attending: Obstetrics and Gynecology | Admitting: Obstetrics and Gynecology

## 2020-09-28 ENCOUNTER — Other Ambulatory Visit: Payer: Self-pay

## 2020-09-28 ENCOUNTER — Emergency Department (HOSPITAL_COMMUNITY)
Admission: AD | Admit: 2020-09-28 | Discharge: 2020-09-28 | Disposition: A | Discharge status: Home / Self Care | Payer: Medicaid Other | Attending: Emergency Medicine | Admitting: Emergency Medicine

## 2020-09-28 ENCOUNTER — Encounter (HOSPITAL_COMMUNITY): Payer: Self-pay

## 2020-09-28 ENCOUNTER — Other Ambulatory Visit: Payer: Self-pay | Admitting: Obstetrics and Gynecology

## 2020-09-28 ENCOUNTER — Ambulatory Visit (HOSPITAL_BASED_OUTPATIENT_CLINIC_OR_DEPARTMENT_OTHER): Payer: Medicaid Other

## 2020-09-28 VITALS — BP 162/104 | HR 67

## 2020-09-28 DIAGNOSIS — O26892 Other specified pregnancy related conditions, second trimester: Secondary | ICD-10-CM | POA: Diagnosis not present

## 2020-09-28 DIAGNOSIS — O09291 Supervision of pregnancy with other poor reproductive or obstetric history, first trimester: Secondary | ICD-10-CM | POA: Insufficient documentation

## 2020-09-28 DIAGNOSIS — O34219 Maternal care for unspecified type scar from previous cesarean delivery: Secondary | ICD-10-CM | POA: Diagnosis not present

## 2020-09-28 DIAGNOSIS — O34211 Maternal care for low transverse scar from previous cesarean delivery: Secondary | ICD-10-CM | POA: Insufficient documentation

## 2020-09-28 DIAGNOSIS — O364XX0 Maternal care for intrauterine death, not applicable or unspecified: Secondary | ICD-10-CM

## 2020-09-28 DIAGNOSIS — O3122X2 Continuing pregnancy after intrauterine death of one fetus or more, second trimester, fetus 2: Secondary | ICD-10-CM

## 2020-09-28 DIAGNOSIS — Z3A23 23 weeks gestation of pregnancy: Secondary | ICD-10-CM | POA: Insufficient documentation

## 2020-09-28 DIAGNOSIS — O30041 Twin pregnancy, dichorionic/diamniotic, first trimester: Secondary | ICD-10-CM | POA: Insufficient documentation

## 2020-09-28 DIAGNOSIS — O365920 Maternal care for other known or suspected poor fetal growth, second trimester, not applicable or unspecified: Secondary | ICD-10-CM

## 2020-09-28 DIAGNOSIS — O99213 Obesity complicating pregnancy, third trimester: Secondary | ICD-10-CM

## 2020-09-28 DIAGNOSIS — O09292 Supervision of pregnancy with other poor reproductive or obstetric history, second trimester: Secondary | ICD-10-CM | POA: Insufficient documentation

## 2020-09-28 DIAGNOSIS — O365921 Maternal care for other known or suspected poor fetal growth, second trimester, fetus 1: Secondary | ICD-10-CM

## 2020-09-28 DIAGNOSIS — R109 Unspecified abdominal pain: Secondary | ICD-10-CM | POA: Insufficient documentation

## 2020-09-28 DIAGNOSIS — R102 Pelvic and perineal pain: Secondary | ICD-10-CM | POA: Diagnosis not present

## 2020-09-28 DIAGNOSIS — O30042 Twin pregnancy, dichorionic/diamniotic, second trimester: Secondary | ICD-10-CM | POA: Diagnosis present

## 2020-09-28 DIAGNOSIS — O99212 Obesity complicating pregnancy, second trimester: Secondary | ICD-10-CM | POA: Insufficient documentation

## 2020-09-28 DIAGNOSIS — Z3A09 9 weeks gestation of pregnancy: Secondary | ICD-10-CM | POA: Insufficient documentation

## 2020-09-28 DIAGNOSIS — E669 Obesity, unspecified: Secondary | ICD-10-CM | POA: Diagnosis not present

## 2020-09-28 DIAGNOSIS — O365990 Maternal care for other known or suspected poor fetal growth, unspecified trimester, not applicable or unspecified: Secondary | ICD-10-CM

## 2020-09-28 DIAGNOSIS — O321XX1 Maternal care for breech presentation, fetus 1: Secondary | ICD-10-CM

## 2020-09-28 DIAGNOSIS — O365922 Maternal care for other known or suspected poor fetal growth, second trimester, fetus 2: Secondary | ICD-10-CM

## 2020-09-28 DIAGNOSIS — O132 Gestational [pregnancy-induced] hypertension without significant proteinuria, second trimester: Secondary | ICD-10-CM

## 2020-09-28 DIAGNOSIS — O26899 Other specified pregnancy related conditions, unspecified trimester: Secondary | ICD-10-CM

## 2020-09-28 DIAGNOSIS — O4102X2 Oligohydramnios, second trimester, fetus 2: Secondary | ICD-10-CM | POA: Diagnosis not present

## 2020-09-28 DIAGNOSIS — O364XX2 Maternal care for intrauterine death, fetus 2: Secondary | ICD-10-CM | POA: Insufficient documentation

## 2020-09-28 DIAGNOSIS — O3113X2 Continuing pregnancy after spontaneous abortion of one fetus or more, third trimester, fetus 2: Secondary | ICD-10-CM | POA: Diagnosis not present

## 2020-09-28 DIAGNOSIS — Z8616 Personal history of COVID-19: Secondary | ICD-10-CM

## 2020-09-28 LAB — URINALYSIS, ROUTINE W REFLEX MICROSCOPIC
Bilirubin Urine: NEGATIVE
Glucose, UA: 50 mg/dL — AB
Hgb urine dipstick: NEGATIVE
Ketones, ur: 5 mg/dL — AB
Leukocytes,Ua: NEGATIVE
Nitrite: NEGATIVE
Protein, ur: 30 mg/dL — AB
Specific Gravity, Urine: 1.025 (ref 1.005–1.030)
pH: 5 (ref 5.0–8.0)

## 2020-09-28 LAB — TYPE AND SCREEN
ABO/RH(D): A POS
Antibody Screen: NEGATIVE

## 2020-09-28 LAB — COMPREHENSIVE METABOLIC PANEL
ALT: 13 U/L (ref 0–44)
AST: 19 U/L (ref 15–41)
Albumin: 3 g/dL — ABNORMAL LOW (ref 3.5–5.0)
Alkaline Phosphatase: 104 U/L (ref 38–126)
Anion gap: 6 (ref 5–15)
BUN: 10 mg/dL (ref 6–20)
CO2: 22 mmol/L (ref 22–32)
Calcium: 9.1 mg/dL (ref 8.9–10.3)
Chloride: 105 mmol/L (ref 98–111)
Creatinine, Ser: 0.86 mg/dL (ref 0.44–1.00)
GFR, Estimated: >60 mL/min (ref >60)
Glucose, Bld: 142 mg/dL — ABNORMAL HIGH (ref 70–99)
Potassium: 4.3 mmol/L (ref 3.5–5.1)
Sodium: 133 mmol/L — ABNORMAL LOW (ref 135–145)
Total Bilirubin: 0.7 mg/dL (ref 0.3–1.2)
Total Protein: 6.8 g/dL (ref 6.5–8.1)

## 2020-09-28 LAB — CBC
HCT: 37.6 % (ref 36.0–46.0)
Hemoglobin: 13 g/dL (ref 12.0–15.0)
MCH: 30.4 pg (ref 26.0–34.0)
MCHC: 34.6 g/dL (ref 30.0–36.0)
MCV: 88.1 fL (ref 80.0–100.0)
Platelets: 223 10*3/uL (ref 150–400)
RBC: 4.27 MIL/uL (ref 3.87–5.11)
RDW: 15.2 % (ref 11.5–15.5)
WBC: 11.9 10*3/uL — ABNORMAL HIGH (ref 4.0–10.5)
nRBC: 0 % (ref 0.0–0.2)

## 2020-09-28 LAB — PROTEIN / CREATININE RATIO, URINE
Creatinine, Urine: 226.87 mg/dL
Protein Creatinine Ratio: 0.09 mg/mg{Cre} (ref 0.00–0.15)
Total Protein, Urine: 20 mg/dL

## 2020-09-28 MED ORDER — BETAMETHASONE SOD PHOS & ACET 6 (3-3) MG/ML IJ SUSP
12.0000 mg | Freq: Once | INTRAMUSCULAR | Status: AC
  Administered 2020-09-28: 12 mg via INTRAMUSCULAR

## 2020-09-28 NOTE — ED Triage Notes (Signed)
Pt is [redacted] weeks pregnant, has sharp right lower pain. Has had nausea, no vomiting. G2, has had pre-eclampsia, first baby delivered at 28 weeks. Pt states heartbeat of one baby was not heard today, pt pregnant with twins.

## 2020-09-28 NOTE — MAU Note (Signed)
Pt came over from main ED with abdominal pain. Twin gestation but found out yesterday baby B did not have FHTs. Also elevated b/p in ED. Slight headache

## 2020-09-28 NOTE — MAU Provider Note (Signed)
Chief Complaint:  Abdominal Pain    Patient seen by provider at 0230   HPI: Virginia Singleton is a 35 y.o. G2P0100 at [redacted]w[redacted]d with twin gestation (one demised) who presents to maternity admissions reporting sharp pains in lower abdomen, initially on left and now on right, wrapping around to her back.  . She denies LOF, vaginal bleeding, vaginal itching/burning, urinary symptoms, h/a, dizziness, n/v, diarrhea, constipation or fever/chills.  She denies headache, visual changes or RUQ abdominal pain.  FHTs were noted to be absent yesterday on one twin, and other twin was noted to be IUGR.   Hx IOL at 28 wks for severe preeclampsia with prior pregnancy  Abdominal Pain This is a new problem. The current episode started today. The problem occurs intermittently. The problem has been unchanged. The pain is located in the LLQ and RLQ. The quality of the pain is sharp. The abdominal pain radiates to the back. Pertinent negatives include no anorexia, constipation, diarrhea, dysuria, fever, frequency, headaches, myalgias, nausea or vomiting. Nothing aggravates the pain. The pain is relieved by nothing. She has tried nothing for the symptoms.    ED Note: Virginia Singleton is a 35 y.o. female, G2P0100, at [redacted]w[redacted]d gestation who presents to the emergency department with complaints of abdominal pain.  Patient was seen earlier today at the MAU.  She is currently pregnant with twins.  They were unable to find a heartbeat on one of the fetuses this morning and she was given injection of betamethasone.  This evening, she developed sharp, shooting right-sided abdominal pain that goes into her right back.  She is having minimal vaginal discharge, but no other fluid leakage, vaginal bleeding, vomiting, fever, or chills.  Past Medical History: Past Medical History:  Diagnosis Date  . Hypercholesteremia   . Migraines     Past obstetric history: OB History  Gravida Para Term Preterm AB Living  2 1 0 1 0 0  SAB  IAB Ectopic Multiple Live Births  0 0 0 0      # Outcome Date GA Lbr Len/2nd Weight Sex Delivery Anes PTL Lv  2 Current           1 Preterm 07/13/16 [redacted]w[redacted]d  880 g F CS-LTranv Spinal  LIV    Past Surgical History: Past Surgical History:  Procedure Laterality Date  . CESAREAN SECTION N/A 07/13/2016   Procedure: CESAREAN SECTION;  Surgeon: Huel Cote, MD;  Location: [facility] BIRTHING SUITES;  Service: Obstetrics;  Laterality: N/A;  . WISDOM TOOTH EXTRACTION      Family History: History reviewed. No pertinent family history.  Social History: Social History   Tobacco Use  . Smoking status: Never Smoker  . Smokeless tobacco: Never Used  Substance Use Topics  . Alcohol use: Yes    Comment: occas.  . Drug use: No    Allergies:  Allergies  Allergen Reactions  . Mushroom Extract Complex Hives and Itching  . Zithromax [Azithromycin] Hives and Rash    Meds:  Medications Prior to Admission  Medication Sig Dispense Refill Last Dose  . acetaminophen (TYLENOL) 325 MG tablet Take 650 mg by mouth every 6 (six) hours as needed for mild pain or headache.     . cyclobenzaprine (FLEXERIL) 5 MG tablet Take 1 tablet (5 mg total) by mouth 3 (three) times daily as needed for muscle spasms. 10 tablet 0   . labetalol (NORMODYNE) 100 MG tablet Take 1 tablet (100 mg total) by mouth 2 (two) times daily. 30 tablet 0   .  Multiple Vitamin (MULTIVITAMIN WITH MINERALS) TABS tablet Take 1 tablet by mouth daily.     . Norethindrone-Ethinyl Estradiol-Fe Biphas (LO LOESTRIN FE) 1 MG-10 MCG / 10 MCG tablet Take 1 tablet by mouth daily.       I have reviewed patient's Past Medical Hx, Surgical Hx, Family Hx, Social Hx, medications and allergies.   ROS:  Review of Systems  Constitutional: Negative for fever.  Gastrointestinal: Positive for abdominal pain. Negative for anorexia, constipation, diarrhea, nausea and vomiting.  Genitourinary: Negative for dysuria and frequency.  Musculoskeletal: Negative for  myalgias.  Neurological: Negative for headaches.   Other systems negative  Physical Exam   Patient Vitals for the past 24 hrs:  BP Temp Pulse Resp SpO2  09/28/20 0012 (!) 153/110 98.5 F (36.9 C) 85 17 94 %   Vitals:   09/28/20 0345 09/28/20 0400 09/28/20 0415 09/28/20 0430  BP: (!) 148/97 (!) 144/97 (!) 133/94 (!) 144/97  Pulse: 68 66 76 65  Resp:      Temp:      SpO2:       Constitutional: Well-developed, well-nourished female in no acute distress.  Cardiovascular: normal rate and rhythm Respiratory: normal effort, clear to auscultation bilaterally GI: Abd soft, non-tender except mildly tender over bilateral round ligaments, abdomen is gravid, appropriate for gestational age.   No rebound or guarding. MS: Extremities nontender, no edema, normal ROM Neurologic: Alert and oriented x 4.  GU: Neg CVAT.  PELVIC EXAM: Cervix long and closed   FHT:  Baseline 145-150 , moderate variability, small accelerations present, no decelerations Contractions: none   Labs: --/--/PENDING (04/13 0232) Results for orders placed or performed during the hospital encounter of 09/28/20 (from the past 24 hour(s))  CBC     Status: Abnormal   Collection Time: 09/28/20  2:32 AM  Result Value Ref Range   WBC 11.9 (H) 4.0 - 10.5 K/uL   RBC 4.27 3.87 - 5.11 MIL/uL   Hemoglobin 13.0 12.0 - 15.0 g/dL   HCT 75.6 43.3 - 29.5 %   MCV 88.1 80.0 - 100.0 fL   MCH 30.4 26.0 - 34.0 pg   MCHC 34.6 30.0 - 36.0 g/dL   RDW 18.8 41.6 - 60.6 %   Platelets 223 150 - 400 K/uL   nRBC 0.0 0.0 - 0.2 %  Comprehensive metabolic panel     Status: Abnormal   Collection Time: 09/28/20  2:32 AM  Result Value Ref Range   Sodium 133 (L) 135 - 145 mmol/L   Potassium 4.3 3.5 - 5.1 mmol/L   Chloride 105 98 - 111 mmol/L   CO2 22 22 - 32 mmol/L   Glucose, Bld 142 (H) 70 - 99 mg/dL   BUN 10 6 - 20 mg/dL   Creatinine, Ser 3.01 0.44 - 1.00 mg/dL   Calcium 9.1 8.9 - 60.1 mg/dL   Total Protein 6.8 6.5 - 8.1 g/dL   Albumin  3.0 (L) 3.5 - 5.0 g/dL   AST 19 15 - 41 U/L   ALT 13 0 - 44 U/L   Alkaline Phosphatase 104 38 - 126 U/L   Total Bilirubin 0.7 0.3 - 1.2 mg/dL   GFR, Estimated >09 >32 mL/min   Anion gap 6 5 - 15  Type and screen     Status: None   Collection Time: 09/28/20  2:32 AM  Result Value Ref Range   ABO/RH(D) A POS    Antibody Screen NEG    Sample Expiration  10/01/2020,2359 Performed at [facility], [address], [place], [place] [address]   Protein / creatinine ratio, urine     Status: None   Collection Time: 09/28/20  3:14 AM  Result Value Ref Range   Creatinine, Urine 226.87 mg/dL   Total Protein, Urine 20 mg/dL   Protein Creatinine Ratio 0.09 0.00 - 0.15 mg/mg[Cre]  Urinalysis, Routine w reflex microscopic     Status: Abnormal   Collection Time: 09/28/20  3:14 AM  Result Value Ref Range   Color, Urine YELLOW YELLOW   APPearance CLEAR CLEAR   Specific Gravity, Urine 1.025 1.005 - 1.030   pH 5.0 5.0 - 8.0   Glucose, UA 50 (A) NEGATIVE mg/dL   Hgb urine dipstick NEGATIVE NEGATIVE   Bilirubin Urine NEGATIVE NEGATIVE   Ketones, ur 5 (A) NEGATIVE mg/dL   Protein, ur 30 (A) NEGATIVE mg/dL   Nitrite NEGATIVE NEGATIVE   Leukocytes,Ua NEGATIVE NEGATIVE   RBC / HPF 0-5 0 - 5 RBC/hpf   WBC, UA 0-5 0 - 5 WBC/hpf   Bacteria, UA RARE (A) NONE SEEN   Squamous Epithelial / LPF 0-5 0 - 5   Mucus PRESENT     Imaging:  No results found.  MAU Course/MDM: I have ordered labs and reviewed results. These are normal NST reviewed, reassuring for gestational age Pain pattern seems consistent with round ligament pain.  Feels better when supine, hurts more when turns to side.  No PTL observed Consult Dr Jolayne Panther and Dr Jackelyn Knife with presentation, exam findings and test results.  They both agree that patient is stable for discharge with close followup in MFM this morning and second dose of Betamethasone this afternoon.  Assessment: Twin Di-Di pregnancy at [redacted]w[redacted]d Hx demise of one  twin with IUGR of other Reassuring FHR of surviving twin New Gestational Hypertension with normal labs  Plan: Discharge home Preterm Labor precautions and fetal kick counts Preeclampsia precautions Follow up in Office for prenatal visits  And MFM today for US/consult Message sent to Dr Judeth Cornfield with update since I don't see MFM appt in epic  Encouraged to return if she develops worsening of symptoms, increase in pain, fever, or other concerning symptoms.  Pt stable at time of discharge.  Wynelle Bourgeois CNM, MSN Certified Nurse-Midwife 09/28/2020 2:57 AM

## 2020-09-28 NOTE — MAU Note (Signed)
Here for 2nd dose of BMZ. Did all right with first.  Was here during the night, 'round ligament pain'.  Doing ok today

## 2020-09-28 NOTE — ED Provider Notes (Signed)
Emergency Medicine Provider OB Triage Evaluation Note  Virginia Singleton is a 35 y.o. female, G2P0100, at [redacted]w[redacted]d gestation who presents to the emergency department with complaints of abdominal pain.  Patient was seen earlier today at the MAU.  She is currently pregnant with twins.  They were unable to find a heartbeat on one of the fetuses this morning and she was given injection of betamethasone.  This evening, she developed sharp, shooting right-sided abdominal pain that goes into her right back.  She is having minimal vaginal discharge, but no other fluid leakage, vaginal bleeding, vomiting, fever, or chills.  Review of  Systems  Positive: Abdominal pain, vaginal discharge Negative: Vaginal bleeding, fluid leakage, fever, chills, vomiting  Physical Exam  BP (!) 153/110 (BP Location: Right Arm)   Pulse 85   Temp 98.5 F (36.9 C)   Resp 17   SpO2 94%  General: Awake, no distress  HEENT: Atraumatic  Resp: Normal effort  Cardiac: Normal rate Abd: Gravid, generalized tenderness to palpation in the lower abdomen.  No rebound or guarding. MSK: Moves all extremities without difficulty Neuro: Speech clear  Medical Decision Making  Pt evaluated for pregnancy concern and is stable for transfer to MAU. Pt is in agreement with plan for transfer.  1:00 AM Discussed with MAU APP, Wynelle Bourgeois, who accepts patient in transfer.  Clinical Impression  No diagnosis found.     Frederik Pear A, PA-C 09/28/20 0100    Mesner, Barbara Cower, MD 09/28/20 [phone]

## 2020-09-28 NOTE — Progress Notes (Signed)
Written and verbal d/c instructions given and understanding voiced. 

## 2020-09-28 NOTE — MAU Note (Signed)
Pt states, Dr Grace Bushy asked that we recheck her BP

## 2020-09-28 NOTE — Progress Notes (Signed)
MFM Consultation  Reason for request: Diamniotic Dichorionic Twin with new FGR and fetal demise of Twin B  Date of Service: 09/28/20 Requesting provider: Dr. Doristine Counter   Virginia Singleton is a G2P1 at 55 w 0d with an EDD of 01/25/21 who is being seen in consultation at the request of Dr. Mindi Slicker for a new diagnosis of diamniotic dichorionic fetal growth restriction with twin B demise, oligohydramnios and reversed end diastolic flow in Twin A.   She is overall doing well today without complaints. She overall has felt healthy and denies vaginal bleeding, loss of fluid or signs/symptoms of preeclampsia.  She had her first course of betamethasone given the new diagnosis and that she is near the age of viability. On the outside examination the report estimated a fetal weight of 310 g.  Her pregnancy history is complicated by a prior iatrogenic preterm delivery due to preeclampsia with severe features at 28 weeks. She presented at 26 weeks and was hospitalized. She had and IOL but was ultimately delivered via cesarean delivery. She required antihypertensive therapy that was discontinued 1-2 weeks after delivery.  The pregnancy has otherwise been uncomplicated. She had a low risk NIPS indicated a female/female pair. She was seen in the MAU yesterday for BMZ administration with elevated of 144/104. Her preeclampsia labs including UPC were normal.   Vitals with BMI 09/28/2020 09/28/2020 09/28/2020  Height - - -  Weight - - -  BMI - - -  Systolic 148 162 010  Diastolic 100 104 99  Pulse 73 67 80   CBC Latest Ref Rng & Units 09/28/2020 01/19/2017 07/14/2016  WBC 4.0 - 10.5 K/uL 11.9(H) 10.1 15.4(H)  Hemoglobin 12.0 - 15.0 g/dL 27.2 53.6 10.8(L)  Hematocrit 36.0 - 46.0 % 37.6 41.2 31.4(L)  Platelets 150 - 400 K/uL 223 278 193   CMP Latest Ref Rng & Units 09/28/2020 01/19/2017 07/14/2016  Glucose 70 - 99 mg/dL 644(I) 347(Q) 95  BUN 6 - 20 mg/dL 10 9 15   Creatinine 0.44 - 1.00 mg/dL 2.59 5.63  Sodium 135  - 145 mmol/L 133(L) 136 133(L)  Potassium 3.5 - 5.1 mmol/L 4.3 4.2 4.7  Chloride 98 - 111 mmol/L 105 105 105  CO2 22 - 32 mmol/L 22 20(L) 23  Calcium 8.9 - 10.3 mg/dL 9.1 9.1 8.75)  Total Protein 6.5 - 8.1 g/dL 6.8 7.6 6.4(P)  Total Bilirubin 0.3 - 1.2 mg/dL 0.7 0.8 0.3  Alkaline Phos 38 - 126 U/L 104 72 65  AST 15 - 41 U/L 19 17 28   ALT 0 - 44 U/L 13 14 21    OB History  Gravida Para Term Preterm AB Living  2 1 0 1 0 1  SAB IAB Ectopic Multiple Live Births  0 0 0 0      # Outcome Date GA Lbr Len/2nd Weight Sex Delivery Anes PTL Lv  2 Current           1 Preterm 07/13/16 [redacted]w[redacted]d  1 lb 15 oz (0.88 kg) F CS-LTranv Spinal  LIV   Past Medical History:  Diagnosis Date  . Hypercholesteremia   . Migraines    Family History  Problem Relation Age of Onset  . Miscarriages / Stillbirths Neg Hx   . Diabetes Neg Hx    No current outpatient medications on file prior to visit.   No current facility-administered medications on file prior to visit.   Social History   Socioeconomic History  . Marital status: Single    Spouse  name: Not on file  . Number of children: Not on file  . Years of education: Not on file  . Highest education level: Not on file  Occupational History  . Not on file  Tobacco Use  . Smoking status: Never Smoker  . Smokeless tobacco: Never Used  Vaping Use  . Vaping Use: Never used  Substance and Sexual Activity  . Alcohol use: Yes    Comment: occas.  . Drug use: No  . Sexual activity: Yes    Birth control/protection: None  Other Topics Concern  . Not on file  Social History Narrative  . Not on file   Social Determinants of Health   Financial Resource Strain: Not on file  Food Insecurity: Not on file  Transportation Needs: Not on file  Physical Activity: Not on file  Stress: Not on file  Social Connections: Not on file  Intimate Partner Violence: Not on file   Allergies  Allergen Reactions  . Mushroom Extract Complex Hives and Itching  .  Zithromax [Azithromycin] Hives and Rash   Imaging:  Today we observed the following: Diamniotic Dichorionic twin pregnancy with demise of Twin B, oligohydramnios, FGR of Twin A with EFW < 1% 287g and REDF  Impression/Counseling:  I discussed with Virginia Singleton the diagnosis, evaluation and management of FGR in diamniotic dichorionic twins with demise of Twin B and known history of early severe preeclampsia.  We discussed today's findings, confirmed demise of twin B, FGR in twin A at less that <1% and reversed end diastolic flow with oligohydramnios. Given her previous history of preeclampsia with severe features I conveyed that she has a 50% chance of recurrence.  I explained that at this time the prognosis is poor, mostly due to the low fetal weight of Twin A of 287 g with reversed end diastolic flow. I explained that REDF suggest increased risk for fetal demise with in 7-14 days. The ductus venosus was also investigated. There was mildly depressed "a-wave" suggesting further down stream effects leading to poor fetal outcomes. However, studies have demonstrated that absent or reversed flow is most strongly associated with fetal demise within 7 days. The ductus venosus in the management of FGR is still investigational.  Further we discussed that given the low EFW, the fetus is not at a weight or gestational age for survival and we recommended a NICU consult to discuss NICU engagement, fetal survival and management at weight/age milestones.  I advised Dr. Mindi Slicker to administer betamethasone prior to this visit and knowing the fetal weight. She will obtain her second dose today. Therefore, will likely need a rescue course at [redacted] weeks gestation.  I explained that we would recommend continued weekly FHT and UA dopplers at Dr. Shella Spearing offices  and to repeat fetal EFW in 3-4 weeks at our offices.   We discussed the increased risk for fetal demise prior to the scheduled growth. She expressed an  understanding that further management would be based on the NICU consultation or weight of 400+ grams.   We will not intervene for worsening fetal status at this time. However, if the fetus reaches 400+ grams or 26 weeks we recommend inpatient management with 2x weekly testing and UA dopplers.   I reviewed this management plan with Virginia Singleton and her mother as well as Dr. Mindi Slicker.  Virginia Singleton blood pressure was elevated and was without symptoms. She was going to MAU for her second dose of betamethasone. I notified Dr. Mindi Slicker and they will repeat her  blood pressure and consider laboratory evaluation.  I spent 60 minutes with > 50% in face to face consultation and care coordination.  All questions answered.   Novella Olive, MD

## 2020-09-28 NOTE — Discharge Instructions (Signed)
Round Ligament Pain  The round ligament is a cord of muscle and tissue that helps support the uterus. It can become a source of pain during pregnancy if it becomes stretched or twisted as the baby grows. The pain usually begins in the second trimester (13-28 weeks) of pregnancy, and it can come and go until the baby is delivered. It is not a serious problem, and it does not cause harm to the baby. Round ligament pain is usually a short, sharp, and pinching pain, but it can also be a dull, lingering, and aching pain. The pain is felt in the lower side of the abdomen or in the groin. It usually starts deep in the groin and moves up to the outside of the hip area. The pain may occur when you:  Suddenly change position, such as quickly going from a sitting to standing position.  Roll over in bed.  Cough or sneeze.  Do physical activity. Follow these instructions at home:  Watch your condition for any changes.  When the pain starts, relax. Then try any of these methods to help with the pain: ? Sitting down. ? Flexing your knees up to your abdomen. ? Lying on your side with one pillow under your abdomen and another pillow between your legs. ? Sitting in a warm bath for 15-20 minutes or until the pain goes away.  Take over-the-counter and prescription medicines only as told by your health care provider.  Move slowly when you sit down or stand up.  Avoid long walks if they cause pain.  Stop or reduce your physical activities if they cause pain.  Keep all follow-up visits as told by your health care provider. This is important.   Contact a health care provider if:  Your pain does not go away with treatment.  You feel pain in your back that you did not have before.  Your medicine is not helping. Get help right away if:  You have a fever or chills.  You develop uterine contractions.  You have vaginal bleeding.  You have nausea or vomiting.  You have diarrhea.  You have pain  when you urinate. Summary  Round ligament pain is felt in the lower abdomen or groin. It is usually a short, sharp, and pinching pain. It can also be a dull, lingering, and aching pain.  This pain usually begins in the second trimester (13-28 weeks). It occurs because the uterus is stretching with the growing baby, and it is not harmful to the baby.  You may notice the pain when you suddenly change position, when you cough or sneeze, or during physical activity.  Relaxing, flexing your knees to your abdomen, lying on one side, or taking a warm bath may help to get rid of the pain.  Get help from your health care provider if the pain does not go away or if you have vaginal bleeding, nausea, vomiting, diarrhea, or painful urination. This information is not intended to replace advice given to you by your health care provider. Make sure you discuss any questions you have with your health care provider. Document Revised: 11/20/2017 Document Reviewed: 11/20/2017 Elsevier Patient Education  2021 Elsevier Inc. Hypertension During Pregnancy High blood pressure (hypertension) is when the force of blood pumping through the arteries is high enough to cause problems with your health. Arteries are blood vessels that carry blood from the heart throughout the body. Hypertension during pregnancy can cause problems for you and your baby. It can be mild or  severe. There are different types of hypertension that can happen during pregnancy. These include:  Chronic hypertension. This happens when you had high blood pressure before you became pregnant, and it continues during the pregnancy. Hypertension that develops before you are [redacted] weeks pregnant and continues during the pregnancy is also called chronic hypertension. If you have chronic hypertension, it will not go away after you have your baby. You will need follow-up visits with your health care provider after you have your baby. Your health care provider may want  you to keep taking medicine for your blood pressure.  Gestational hypertension. This is hypertension that develops after the 20th week of pregnancy. Gestational hypertension usually goes away after you have your baby, but your health care provider will need to monitor your blood pressure to make sure that it is getting better.  Postpartum hypertension. This is high blood pressure that was present before delivery and continues after delivery or that starts after delivery. This usually occurs within 48 hours after childbirth but may occur up to 6 weeks after giving birth. When hypertension during pregnancy is severe, it is a medical emergency that requires treatment right away. How does this affect me? Women who have hypertension during pregnancy have a greater chance of developing hypertension later in life or during future pregnancies. In some cases, hypertension during pregnancy can cause serious complications, such as:  Stroke.  Heart attack.  Injury to other organs, such as kidneys, lungs, or liver.  Preeclampsia.  A condition called hemolysis, elevated liver enzymes, and low platelet count (HELLP) syndrome.  Convulsions or seizures.  Placental abruption. How does this affect my baby? Hypertension during pregnancy can affect your baby. Your baby may:  Be born early (prematurely).  Not weigh as much as he or she should at birth (low birth weight).  Not tolerate labor well, leading to an unplanned cesarean delivery. This condition may also result in a baby's death before birth (stillbirth). What are the risks? There are certain factors that make it more likely for you to develop hypertension during pregnancy. These include:  Having hypertension during a previous pregnancy or a family history of hypertension.  Being overweight.  Being age 34 or older.  Being pregnant for the first time.  Being pregnant with more than one baby.  Becoming pregnant using fertilization  methods, such as IVF (in vitro fertilization).  Having other medical problems, such as diabetes, kidney disease, or lupus. What can I do to lower my risk? The exact cause of hypertension during pregnancy is not known. You may be able to lower your risk by:  Maintaining a healthy weight.  Eating a healthy and balanced diet.  Following your health care provider's instructions about treating any long-term conditions that you had before becoming pregnant. It is very important to keep all of your prenatal care appointments. Your health care provider will check your blood pressure and make sure that your pregnancy is progressing as expected. If a problem is found, early treatment can prevent complications.   How is this treated? Treatment for hypertension during pregnancy varies depending on the type of hypertension you have and how serious it is.  If you were taking medicine for high blood pressure before you became pregnant, talk with your health care provider. You may need to change medicine during pregnancy because some medicines, like ACE inhibitors, may not be considered safe for your baby.  If you have gestational hypertension, your health care provider may order medicine to treat this  during pregnancy.  If you are at risk for preeclampsia, your health care provider may recommend that you take a low-dose aspirin during your pregnancy.  If you have severe hypertension, you may need to be hospitalized so you and your baby can be monitored closely. You may also need to be given medicine to lower your blood pressure.  In some cases, if your condition gets worse, you may need to deliver your baby early. Follow these instructions at home: Eating and drinking  Drink enough fluid to keep your urine pale yellow.  Avoid caffeine.   Lifestyle  Do not use any products that contain nicotine or tobacco. These products include cigarettes, chewing tobacco, and vaping devices, such as e-cigarettes.  If you need help quitting, ask your health care provider.  Do not use alcohol or drugs.  Avoid stress as much as possible.  Rest and get plenty of sleep.  Regular exercise can help to reduce your blood pressure. Ask your health care provider what kinds of exercise are best for you. General instructions  Take over-the-counter and prescription medicines only as told by your health care provider.  Keep all prenatal and follow-up visits. This is important. Contact a health care provider if:  You have symptoms that your health care provider told you may require more treatment or monitoring, such as: ? Headaches. ? Nausea or vomiting. ? Abdominal pain. ? Dizziness. ? Light-headedness. Get help right away if:  You have symptoms of serious complications, such as: ? Severe abdominal pain that does not get better with treatment. ? A severe headache that does not get better, blurred vision, or double vision. ? Vomiting that does not get better. ? Sudden, rapid weight gain or swelling in your hands, ankles, or face. ? Vaginal bleeding. ? Blood in your urine. ? Shortness of breath or chest pain. ? Weakness on one side of your body or difficulty speaking.  Your baby is not moving as much as usual. These symptoms may represent a serious problem that is an emergency. Do not wait to see if the symptoms will go away. Get medical help right away. Call your local emergency services (911 in the [place] Do not drive yourself to the hospital. Summary  Hypertension during pregnancy can cause problems for you and your baby.  Treatment for hypertension during pregnancy varies depending on the type of hypertension you have and how serious it is.  Keep all prenatal and follow-up visits. This is important.  Get help right away if you have symptoms of serious complications related to high blood pressure. This information is not intended to replace advice given to you by your health care provider. Make  sure you discuss any questions you have with your health care provider. Document Revised: 02/25/2020 Document Reviewed: 02/25/2020 Elsevier Patient Education  2021 [employer].

## 2020-09-28 NOTE — Progress Notes (Signed)
Patient going to MAU for injection, will have BP rechecked while there.

## 2020-10-05 ENCOUNTER — Encounter: Payer: Self-pay | Admitting: Neonatology

## 2020-10-05 NOTE — Progress Notes (Signed)
Virginia Singleton  Outpatient Consultation  Consultation Service: Neonatology   Dr. Terri Piedra has asked for consultation on Virginia Singleton regarding the care of a severely growth restricted extremely preterm infant. Thank you for inviting [place] to see this patient.   Reason for consult:  Explain the possible complications, the prognosis, and the care of a premature infant at 24 weeks and beyond, complicated by severe growth restriction with EFW 287 grams at 23 weeks ([place] on 4.11.2022).   Chief complaint: 35 year old female with a twin IUP, recent demise of twin B last week, Twin A has an estimated weight of ~300 grams.  My key findings of this patient's HPI are:  I have reviewed the patient's chart and have met with her. The salient information is as follows:   Prenatal labs: negative/normal, blood type A+   Prenatal care:   good Pregnancy complications:  history of preterm birth at 57 weeks by C-section due to severe pre-eclampsia. Maternal antibiotics: This patient's mother is not on file. Maternal Steroids:  Completed two doses of betamethasone on 4/12 and 4/13  Virginia Singleton is pregnant with di-di twins, s/p fetal demise of twin B (female) last week. Twin A had evidence of cardiac activity at that time but was noted to have severe IUGR with EFW <1%, oligohydramnios, and reversed end-diastolic flow. There is a high chance of IUFD for baby A given these circumstances. MFM also counseled Virginia Singleton re: extremely low chance for survival after birth at the current size and a NICU consultation was requested to further discuss post-natal care for Twin A, to be named, "Virginia Singleton." Mother is familiar with NICU care, as her daughter Virginia Singleton was born at 58 weeks and was hospitalized for ~2 months at [facility] NICU. Virginia Singleton had an uneventful course and is a healthy 30-year-old now.   My recommendations for this patient and my actions included:   1. In the presence of Ms.  Andres Singleton, I spent 25 minutes discussing the possible complications and outcomes of prematurity at this gestational age. She declined to have anyone else present or on the phone for the consultation. I discussed the almost certain need for resuscitation at birth, mechanical ventilation and surfactant administration for respiratory distress, IV fluids pending establishment of enteral feeds (encouraged breast milk feeding to which she plans to do), antibiotics for possible sepsis, temperature support, and monitoring. I also discussed the potential risk of complications such as intracranial hemorrhage, retinopathy, NEC, and chronic lung disease, and that these risks are rather high given Virginia Singleton's small size. I also discussed the potential length of stay in the neonatal intensive care unit. She expressed understanding of the expected course, should Virginia Singleton survive the initial newborn period.  2. We discussed that Virginia Singleton's survival after birth may be limited by her small size and expected poor lung maturity. She expressed understanding.  3. I informed her that the NICU team would be present at the delivery. She agreed that all appropriate medical measures could be taken to resuscitate her infant at the delivery. She understands that given Virginia Singleton's size, resuscitative measures, such as placement of a breathing tube, may not be possible, particularly if she weighs <400-500 grams at birth. She also understood that depending on her infant's initial condition and NICU course, some difficult decisions may have to be made. Ms. Butrick desires a trial of intensive care and resuscitative measures.  4. NICU visitation policy was discussed as were various resources to support her and her family during the hospitalization.  ______________________________________________________________________  Thank you for asking [place] to participate in the care of this patient. Please do not hesitate to contact us again if you are aware of  any further ways we can be of assistance. Mother has Virginia Pickles RN's contact information and is encouraged to call if she has further questions.   Renato Shin, MD Neonatologist  I spent ~40 minutes in consultation time, of which 25 minutes was spent in direct face to face counseling.

## 2020-10-17 ENCOUNTER — Encounter (HOSPITAL_COMMUNITY): Payer: Self-pay | Admitting: Obstetrics and Gynecology

## 2020-10-17 ENCOUNTER — Inpatient Hospital Stay (HOSPITAL_COMMUNITY)
Admission: AD | Admit: 2020-10-17 | Discharge: 2020-10-20 | DRG: 797 | Disposition: A | Discharge status: Home / Self Care | Payer: Medicaid Other | Attending: Obstetrics and Gynecology | Admitting: Obstetrics and Gynecology

## 2020-10-17 ENCOUNTER — Other Ambulatory Visit: Payer: Self-pay

## 2020-10-17 DIAGNOSIS — O1002 Pre-existing essential hypertension complicating childbirth: Secondary | ICD-10-CM | POA: Diagnosis present

## 2020-10-17 DIAGNOSIS — O365921 Maternal care for other known or suspected poor fetal growth, second trimester, fetus 1: Secondary | ICD-10-CM | POA: Diagnosis present

## 2020-10-17 DIAGNOSIS — O321XX0 Maternal care for breech presentation, not applicable or unspecified: Secondary | ICD-10-CM | POA: Diagnosis present

## 2020-10-17 DIAGNOSIS — Z20822 Contact with and (suspected) exposure to covid-19: Secondary | ICD-10-CM | POA: Diagnosis present

## 2020-10-17 DIAGNOSIS — O43219 Placenta accreta, unspecified trimester: Secondary | ICD-10-CM

## 2020-10-17 DIAGNOSIS — O4102X1 Oligohydramnios, second trimester, fetus 1: Secondary | ICD-10-CM | POA: Diagnosis present

## 2020-10-17 DIAGNOSIS — O43212 Placenta accreta, second trimester: Secondary | ICD-10-CM | POA: Diagnosis present

## 2020-10-17 DIAGNOSIS — Z3A25 25 weeks gestation of pregnancy: Secondary | ICD-10-CM | POA: Diagnosis not present

## 2020-10-17 DIAGNOSIS — O364XX0 Maternal care for intrauterine death, not applicable or unspecified: Secondary | ICD-10-CM | POA: Diagnosis present

## 2020-10-17 DIAGNOSIS — O364XX2 Maternal care for intrauterine death, fetus 2: Secondary | ICD-10-CM | POA: Diagnosis present

## 2020-10-17 DIAGNOSIS — O30042 Twin pregnancy, dichorionic/diamniotic, second trimester: Secondary | ICD-10-CM | POA: Diagnosis present

## 2020-10-17 DIAGNOSIS — O34211 Maternal care for low transverse scar from previous cesarean delivery: Secondary | ICD-10-CM | POA: Diagnosis present

## 2020-10-17 DIAGNOSIS — O364XX1 Maternal care for intrauterine death, fetus 1: Principal | ICD-10-CM

## 2020-10-17 DIAGNOSIS — O368130 Decreased fetal movements, third trimester, not applicable or unspecified: Secondary | ICD-10-CM | POA: Diagnosis not present

## 2020-10-17 LAB — COMPREHENSIVE METABOLIC PANEL
ALT: 18 U/L (ref 0–44)
AST: 21 U/L (ref 15–41)
Albumin: 3 g/dL — ABNORMAL LOW (ref 3.5–5.0)
Alkaline Phosphatase: 114 U/L (ref 38–126)
Anion gap: 9 (ref 5–15)
BUN: 11 mg/dL (ref 6–20)
CO2: 23 mmol/L (ref 22–32)
Calcium: 9 mg/dL (ref 8.9–10.3)
Chloride: 104 mmol/L (ref 98–111)
Creatinine, Ser: 0.78 mg/dL (ref 0.44–1.00)
GFR, Estimated: >60 mL/min (ref >60)
Glucose, Bld: 75 mg/dL (ref 70–99)
Potassium: 4 mmol/L (ref 3.5–5.1)
Sodium: 136 mmol/L (ref 135–145)
Total Bilirubin: 0.5 mg/dL (ref 0.3–1.2)
Total Protein: 6.9 g/dL (ref 6.5–8.1)

## 2020-10-17 LAB — CBC
HCT: 39.1 % (ref 36.0–46.0)
HCT: 38.9 % (ref 36.0–46.0)
Hemoglobin: 13.9 g/dL (ref 12.0–15.0)
Hemoglobin: 13.6 g/dL (ref 12.0–15.0)
MCH: 30.9 pg (ref 26.0–34.0)
MCH: 30.2 pg (ref 26.0–34.0)
MCHC: 35.5 g/dL (ref 30.0–36.0)
MCHC: 35 g/dL (ref 30.0–36.0)
MCV: 86.9 fL (ref 80.0–100.0)
MCV: 86.3 fL (ref 80.0–100.0)
Platelets: 200 10*3/uL (ref 150–400)
Platelets: 197 10*3/uL (ref 150–400)
RBC: 4.5 MIL/uL (ref 3.87–5.11)
RBC: 4.51 MIL/uL (ref 3.87–5.11)
RDW: 14.6 % (ref 11.5–15.5)
RDW: 14.6 % (ref 11.5–15.5)
WBC: 8.9 10*3/uL (ref 4.0–10.5)
WBC: 9.2 10*3/uL (ref 4.0–10.5)
nRBC: 0 % (ref 0.0–0.2)
nRBC: 0 % (ref 0.0–0.2)

## 2020-10-17 LAB — TYPE AND SCREEN
ABO/RH(D): A POS
Antibody Screen: NEGATIVE

## 2020-10-17 LAB — PROTEIN / CREATININE RATIO, URINE
Creatinine, Urine: 53.83 mg/dL
Total Protein, Urine: <6 mg/dL

## 2020-10-17 LAB — URIC ACID: Uric Acid, Serum: 6.1 mg/dL (ref 2.5–7.1)

## 2020-10-17 LAB — RESP PANEL BY RT-PCR (FLU A&B, COVID) ARPGX2
Influenza A by PCR: NEGATIVE
Influenza B by PCR: NEGATIVE
SARS Coronavirus 2 by RT PCR: NEGATIVE

## 2020-10-17 LAB — LACTATE DEHYDROGENASE: LDH: 230 U/L — ABNORMAL HIGH (ref 98–192)

## 2020-10-17 MED ORDER — OXYTOCIN-SODIUM CHLORIDE 30-0.9 UT/500ML-% IV SOLN: 2.5000 [IU]/h | INTRAVENOUS | Status: DC

## 2020-10-17 MED ORDER — OXYCODONE-ACETAMINOPHEN 5-325 MG PO TABS: 1.0000 | ORAL_TABLET | ORAL | Status: DC | PRN

## 2020-10-17 MED ORDER — BUTORPHANOL TARTRATE 1 MG/ML IJ SOLN
1.0000 mg | INTRAMUSCULAR | Status: DC | PRN
  Administered 2020-10-17: 1 mg via INTRAVENOUS
  Filled 2020-10-17: qty 1

## 2020-10-17 MED ORDER — LACTATED RINGERS IV SOLN: 500.0000 mL | Freq: Once | INTRAVENOUS | Status: DC

## 2020-10-17 MED ORDER — MISOPROSTOL 200 MCG PO TABS
200.0000 ug | ORAL_TABLET | ORAL | Status: AC | PRN
  Administered 2020-10-17 – 2020-10-18 (×3): 200 ug via VAGINAL
  Filled 2020-10-17 (×3): qty 1

## 2020-10-17 MED ORDER — LACTATED RINGERS IV SOLN: 500.0000 mL | INTRAVENOUS | Status: DC | PRN

## 2020-10-17 MED ORDER — ONDANSETRON HCL 4 MG/2ML IJ SOLN: 4.0000 mg | Freq: Four times a day (QID) | INTRAMUSCULAR | Status: DC | PRN

## 2020-10-17 MED ORDER — EPHEDRINE 5 MG/ML INJ: 10.0000 mg | INTRAVENOUS | Status: DC | PRN

## 2020-10-17 MED ORDER — OXYCODONE-ACETAMINOPHEN 5-325 MG PO TABS
2.0000 | ORAL_TABLET | ORAL | Status: DC | PRN
Start: 2020-10-17 — End: 2020-10-19

## 2020-10-17 MED ORDER — OXYTOCIN-SODIUM CHLORIDE 30-0.9 UT/500ML-% IV SOLN
1.0000 m[IU]/min | INTRAVENOUS | Status: DC
  Administered 2020-10-18: 2 m[IU]/min via INTRAVENOUS
  Filled 2020-10-17: qty 500

## 2020-10-17 MED ORDER — LACTATED RINGERS IV SOLN: INTRAVENOUS | Status: DC

## 2020-10-17 MED ORDER — PHENYLEPHRINE 40 MCG/ML (10ML) SYRINGE FOR IV PUSH (FOR BLOOD PRESSURE SUPPORT): 80.0000 ug | PREFILLED_SYRINGE | INTRAVENOUS | Status: DC | PRN

## 2020-10-17 MED ORDER — SOD CITRATE-CITRIC ACID 500-334 MG/5ML PO SOLN
30.0000 mL | ORAL | Status: DC | PRN
  Administered 2020-10-19: 30 mL via ORAL
  Filled 2020-10-17: qty 15

## 2020-10-17 MED ORDER — ACETAMINOPHEN 325 MG PO TABS
650.0000 mg | ORAL_TABLET | ORAL | Status: DC | PRN
  Administered 2020-10-17 – 2020-10-20 (×2): 650 mg via ORAL
  Filled 2020-10-17 (×2): qty 2

## 2020-10-17 MED ORDER — DIPHENHYDRAMINE HCL 50 MG/ML IJ SOLN
12.5000 mg | INTRAMUSCULAR | Status: DC | PRN
Start: 2020-10-17 — End: 2020-10-19

## 2020-10-17 MED ORDER — LIDOCAINE HCL (PF) 1 % IJ SOLN: 30.0000 mL | INTRAMUSCULAR | Status: DC | PRN

## 2020-10-17 MED ORDER — FENTANYL-BUPIVACAINE-NACL 0.5-0.125-0.9 MG/250ML-% EP SOLN
12.0000 mL/h | EPIDURAL | Status: DC | PRN
  Administered 2020-10-18: 12 mL/h via EPIDURAL
  Filled 2020-10-17 (×2): qty 250

## 2020-10-17 MED ORDER — OXYTOCIN BOLUS FROM INFUSION: 333.0000 mL | Freq: Once | INTRAVENOUS | Status: DC

## 2020-10-17 NOTE — Progress Notes (Signed)
Patient starting to feel some cramping. Considering epidural. PreE labs WNL as follows, BP range 130s-140s/90s  13.9/39.1, plt 200, Cr 0.78, AST/ALT 21/18, uPC below reportable range  Epidural when desired, check after placed

## 2020-10-17 NOTE — MAU Provider Note (Signed)
  History     CSN: [id number]  Arrival date and time: 10/17/20 1424   None     Chief Complaint  Patient presents with  . Decreased Fetal Movement   HPI 35yo G2P0101 at [redacted]w[redacted]d with [facility] twins with severe growth restriction of twin A and demise of baby B. She has had decreased fetal movement over the past several days. She had a doppler that she purchased and couldn't find the baby's heartrate and presents for evaluation.    OB History    Gravida  2   Para  1   Term  0   Preterm  1   AB  0   Living  1     SAB  0   IAB  0   Ectopic  0   Multiple  0   Live Births              Past Medical History:  Diagnosis Date  . Hypercholesteremia   . Migraines     Past Surgical History:  Procedure Laterality Date  . CESAREAN SECTION N/A 07/13/2016   Procedure: CESAREAN SECTION;  Surgeon: Huel Cote, MD;  Location: [facility];  Service: Obstetrics;  Laterality: N/A;  . WISDOM TOOTH EXTRACTION      Family History  Problem Relation Age of Onset  . Miscarriages / Stillbirths Neg Hx   . Diabetes Neg Hx     Social History   Tobacco Use  . Smoking status: Never Smoker  . Smokeless tobacco: Never Used  Vaping Use  . Vaping Use: Never used  Substance Use Topics  . Alcohol use: Yes    Comment: occas.  . Drug use: No    Allergies:  Allergies  Allergen Reactions  . Zithromax [Azithromycin] Hives and Rash    Medications Prior to Admission  Medication Sig Dispense Refill Last Dose  . Prenatal Vit-Fe Fumarate-FA (PRENATAL MULTIVITAMIN) TABS tablet Take 1 tablet by mouth daily at 12 noon.       Review of Systems Physical Exam   Blood pressure (!) 137/96, pulse 83, temperature 98.6 F (37 C), temperature source Oral, resp. rate 18, height 5\' 6"  (1.676 m), weight 105.9 kg, SpO2 98 %, unknown if currently breastfeeding.  Physical Exam Vitals and nursing note reviewed.  Constitutional:      Appearance: Normal appearance.  Cardiovascular:      Rate and Rhythm: Normal rate and regular rhythm.  Pulmonary:     Effort: Pulmonary effort is normal.     Breath sounds: Normal breath sounds.  Abdominal:     General: Abdomen is flat.  Neurological:     Mental Status: She is alert.  Psychiatric:        Mood and Affect: Mood normal.        Behavior: Behavior normal.        Thought Content: Thought content normal.        Judgment: Judgment normal.     MAU Course  Procedures  MDM Limited Bedside done. No fetal cardiac activity seen for either twin.   Assessment and Plan   1. Dichorionic diamniotic twin pregnancy in second trimester   2. Fetal demise, greater than 22 weeks, antepartum, fetus 1 of multiple gestation   3. Fetal demise, greater than 22 weeks, antepartum, fetus 2 of multiple gestation    Discussed with Dr [place]. She will come and see the patient and assume care.  Reina Fuse 10/17/2020, 3:31 PM

## 2020-10-17 NOTE — MAU Note (Signed)
Hasn't felt movement in 2 days.  Spoke with Dr Grace Bushy at MFM, he instructed her to come here. No abd pain. No LOF or bleeding.  Slight HA, has been crying, anxious.

## 2020-10-17 NOTE — H&P (Addendum)
Virginia Singleton is a 35 y.o. female presenting for decreased fetal movement x1.5d. Denies cramping, LOF, vaginal bleeding. 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 Had been counseled about possibility of demise. Initial plan was to admit to antepartum at 26wks/400g for weekly dopplers and 2x/wk monitoring.  Patient is tearful on exam but otherwise denies cramping. Also denies PreE symptoms including HA, RUQ pain, blurry vision, LE edema, SOB. Denies HSV prodromal symptoms  OB History    Gravida  2   Para  1   Term  0   Preterm  1   AB  0   Living  1     SAB  0   IAB  0   Ectopic  0   Multiple  0   Live Births             Past Medical History:  Diagnosis Date  . Hypercholesteremia   . Migraines    Past Surgical History:  Procedure Laterality Date  . CESAREAN SECTION N/A 07/13/2016   Procedure: CESAREAN SECTION;  Surgeon: 07/15/2016, MD;  Location: [facility] BIRTHING SUIT[facility];  Service: Obstetrics;  Laterality: N/A;  . WISDOM TOOTH EXTRACTION     Family History: family history is not on file. Social History:  reports that she has never smoked. She has never used smokeless tobacco. She reports current alcohol use. She reports that she does not use drugs.      Genetic Screening: Normal Maternal Ultrasounds/Referrals: IUGR Fetal Ultrasounds or other Referrals:   Referred to Materal Fetal Medicine  Maternal Substance Abuse:  No Significant Maternal Medications:  None Significant Maternal Lab Results:  None Other Comments:  None  Review of Systems  Constitutional: Negative for chills and fever.  Respiratory: Negative for shortness of breath.   Cardiovascular: Negative for chest pain, palpitations and leg swelling.  Gastrointestinal: Negative for abdominal pain and vomiting.  Neurological: Negative for dizziness, weakness and headaches.  Psychiatric/Behavioral: Negative for suicidal ideas.   Maternal Medical History:  Fetal activity: Perceived fetal activity is decreased.   Last perceived fetal movement was within the past 24 hours.    Prenatal complications: PIH and IUGR.   No bleeding, infection, polyhydramnios, preterm labor, substance abuse or thrombocytopenia.   Prenatal Complications - Diabetes: none.      Blood pressure (!) 141/100, pulse 90, temperature 98.6 F (37 C), temperature source Oral, resp. rate 18, height 5\' 6"  (1.676 m), weight 105.9 kg, SpO2 98 %, unknown if currently breastfeeding. Exam Physical Exam Constitutional:      General: She is not in acute distress.    Appearance: She is well-developed.  HENT:     Head: Normocephalic and atraumatic.  Eyes:     Pupils: Pupils are equal, round, and reactive to light.  Cardiovascular:     Rate and Rhythm: Normal rate and regular rhythm.  Heart sounds: No murmur heard. No gallop.   Abdominal:     Tenderness: There is no abdominal tenderness. There is no guarding or rebound.     Comments: FH 26  Genitourinary:    Vagina: Normal.     Comments: CE closed and thick but soft Musculoskeletal:        General: Normal range of motion.     Cervical back: Normal range of motion and neck supple.  Skin:    General: Skin is warm and dry.  Neurological:     Mental Status: She is alert and oriented to person, place, and time.     BSUS: Baby B breech, likely complete. NO  measurable fluid noted. Macerated fetal skull of baby A noted on maternal RUQ Prenatal labs: ABO, Rh: --/--/A POS (04/13 6962) Antibody: NEG (04/13 0232) Rubella:  imm RPR:   nr HBsAg:   neg HIV:   nr GBS:   unk  Assessment/Plan: This is a 35yo G2P0101 @ 25 5/7 by LMP c/w 8wk scan presents with IUFD of baby A. Baby B demise at 19wks. Ho PreE w/ SF requiring preterm delivery via stat PLTCS for FHR decels, currently on Procardia 30XL QD for CHTN (asx today). Confirmed absent heart tones consistent with demise in MAU this afternoon  Reviewed with patient method of delivery. Amenable to attempting vaginal route, would consider IUPC to instill fluid after ruptured given preterm breech gestation with EFW 400mg . Reviewed risk of possible head entrapment and subsequent actions. Patient may have epidural upon request  Offered to let patient go home and return at her comfort as she is not currently in pain, bleeding or leaking. Would like to Singleton process now and partner come in evening.  Will draw PreE labs on admission. Plan to ripen with PV cytotec then rupture and initiate pitocin per protocol.   Valerie Roys Madalaine Portier 10/17/2020, 4:26 PM

## 2020-10-17 NOTE — Progress Notes (Signed)
Initial visit with Rubylee to introduce spiritual care and offer support after pt learned that her daughter Clinton Gallant had died in utero.  A month ago, Takeela learned that Chloe's twin brother Colon Branch did not have a heart beat. Chaplain asked open ended questions and facilitated story telling and expression of feelings related to the loss.  Chaplain invited patient to explore her hopes and fears about the birth process and offered education regarding what patient might expect during her stay.  During our visit, pt's partner called and it became clear that he misunderstood their earlier conversation and did not realize that Chloe had died. Pt reports feeling overwhelmed by everything but ready to begin the induction process.  Pt is aware spiritual care is available 24/7 and this writer will also follow up tomorrow.  Chaplain updated pt's RN, Candise Bowens, and Dr Reina Fuse about visit.  Please page as further needs arise.  Maryanna Shape. Carley Hammed, M.Div. [facility] Chaplain Pager [phone] Office [phone]       10/17/20 1817  Clinical Encounter Type  Visited With Patient;Health care provider  Visit Type Initial;Spiritual support;Psychological support;Death  Spiritual Encounters  Spiritual Needs Emotional;Grief support

## 2020-10-18 ENCOUNTER — Inpatient Hospital Stay (HOSPITAL_COMMUNITY): Payer: Medicaid Other | Admitting: Anesthesiology

## 2020-10-18 LAB — CBC WITH DIFFERENTIAL/PLATELET
Abs Immature Granulocytes: 0.02 10*3/uL (ref 0.00–0.07)
Basophils Absolute: 0 10*3/uL (ref 0.0–0.1)
Basophils Relative: 1 %
Eosinophils Absolute: 0.1 10*3/uL (ref 0.0–0.5)
Eosinophils Relative: 2 %
HCT: 38.9 % (ref 36.0–46.0)
Hemoglobin: 13.4 g/dL (ref 12.0–15.0)
Immature Granulocytes: 0 %
Lymphocytes Relative: 36 %
Lymphs Abs: 3 10*3/uL (ref 0.7–4.0)
MCH: 30.5 pg (ref 26.0–34.0)
MCHC: 34.4 g/dL (ref 30.0–36.0)
MCV: 88.4 fL (ref 80.0–100.0)
Monocytes Absolute: 0.6 10*3/uL (ref 0.1–1.0)
Monocytes Relative: 7 %
Neutro Abs: 4.6 10*3/uL (ref 1.7–7.7)
Neutrophils Relative %: 54 %
Platelets: 187 10*3/uL (ref 150–400)
RBC: 4.4 MIL/uL (ref 3.87–5.11)
RDW: 14.7 % (ref 11.5–15.5)
WBC: 8.3 10*3/uL (ref 4.0–10.5)
nRBC: 0 % (ref 0.0–0.2)

## 2020-10-18 LAB — DIC (DISSEMINATED INTRAVASCULAR COAGULATION)PANEL
D-Dimer, Quant: 2.88 ug/mL-FEU — ABNORMAL HIGH (ref 0.00–0.50)
Fibrinogen: 488 mg/dL — ABNORMAL HIGH (ref 210–475)
INR: 0.9 (ref 0.8–1.2)
Platelets: 209 10*3/uL (ref 150–400)
Prothrombin Time: 12.6 seconds (ref 11.4–15.2)
Smear Review: NONE SEEN
aPTT: 37 seconds — ABNORMAL HIGH (ref 24–36)

## 2020-10-18 LAB — RPR: RPR Ser Ql: NONREACTIVE

## 2020-10-18 MED ORDER — FENTANYL CITRATE (PF) 100 MCG/2ML IJ SOLN
100.0000 ug | Freq: Once | INTRAMUSCULAR | Status: AC
  Administered 2020-10-18: 100 ug via EPIDURAL

## 2020-10-18 MED ORDER — LIDOCAINE HCL (PF) 1 % IJ SOLN
INTRAMUSCULAR | Status: DC | PRN
  Administered 2020-10-18 (×2): 5 mL via EPIDURAL

## 2020-10-18 MED ORDER — FENTANYL CITRATE (PF) 100 MCG/2ML IJ SOLN
INTRAMUSCULAR | Status: AC
  Filled 2020-10-18: qty 2

## 2020-10-18 MED ORDER — BUPIVACAINE HCL (PF) 0.25 % IJ SOLN
INTRAMUSCULAR | Status: DC | PRN
  Administered 2020-10-18: 10 mL via EPIDURAL

## 2020-10-18 MED ORDER — NIFEDIPINE ER OSMOTIC RELEASE 30 MG PO TB24
30.0000 mg | ORAL_TABLET | Freq: Every day | ORAL | Status: DC
  Administered 2020-10-18 – 2020-10-19 (×2): 30 mg via ORAL
  Filled 2020-10-18 (×2): qty 1

## 2020-10-18 MED ORDER — FENTANYL CITRATE (PF) 100 MCG/2ML IJ SOLN
INTRAMUSCULAR | Status: DC | PRN
  Administered 2020-10-18: 100 ug via EPIDURAL

## 2020-10-18 MED ORDER — SCOPOLAMINE 1 MG/3DAYS TD PT72
1.0000 | MEDICATED_PATCH | TRANSDERMAL | Status: DC
  Administered 2020-10-19: 1.5 mg via TRANSDERMAL
  Filled 2020-10-18: qty 1

## 2020-10-18 NOTE — Progress Notes (Signed)
Patient ID: Virginia Singleton, female   DOB: 1985-09-27, 35 y.o.   MRN: [id number] Pt reports 5/10 pain with contractions when they do occur. Has long periods of rest in between. Coping well. No bleeding or LOF Denies HA or blurry vision VS: 149/97 GEN - NAD SVE- closed/80/+2  A/P: 35yo G2P0101 at 34 6/[redacted]wks gestation with demise of di/di twins, cHTN          - third dose of cytotec placed          - Bedside US confirms vertex/vertex          - Pain control prn          - Pt may eat          - Discussed autopsy vs anora genetic screening. Explained expectations with both and information likely to be gathered. Pt opts for anora at this time          - Discussed pt desires after baby ( ies) born - unsure yet if wants to hold them immediately. She has spoken to funeral home as well

## 2020-10-18 NOTE — Anesthesia Procedure Notes (Signed)
Epidural Patient location during procedure: OB Start time: 10/18/2020 11:42 AM End time: 10/18/2020 11:52 AM  Staffing Anesthesiologist: Leonides Grills, MD Performed: anesthesiologist   Preanesthetic Checklist Completed: patient identified, IV checked, site marked, risks and benefits discussed, monitors and equipment checked, pre-op evaluation and timeout performed  Epidural Patient position: sitting Prep: DuraPrep Patient monitoring: heart rate, cardiac monitor, continuous pulse ox and blood pressure Approach: midline Location: L4-L5 Injection technique: LOR air  Needle:  Needle type: Tuohy  Needle gauge: 17 G Needle length: 9 cm Needle insertion depth: 7 cm Catheter type: closed end flexible Catheter size: 19 Gauge Catheter at skin depth: 12 cm Test dose: negative and Other  Assessment Events: blood not aspirated, injection not painful, no injection resistance and negative IV test  Additional Notes Informed consent obtained prior to proceeding including risk of failure, 1% risk of PDPH, risk of minor discomfort and bruising. Discussed alternatives to epidural analgesia and patient desires to proceed.  Timeout performed pre-procedure verifying patient name, procedure, and platelet count.  Patient tolerated procedure well. Reason for block:procedure for pain

## 2020-10-18 NOTE — Anesthesia Preprocedure Evaluation (Addendum)
Anesthesia Evaluation  Patient identified by MRN, date of birth, ID band Patient awake    Reviewed: Allergy & Precautions, H&P , NPO status , Patient's Chart, lab work & pertinent test results  History of Anesthesia Complications Negative for: history of anesthetic complications  Airway Mallampati: II  TM Distance: >3 FB Neck ROM: full    Dental no notable dental hx. (+) Teeth Intact   Pulmonary neg pulmonary ROS,    Pulmonary exam normal breath sounds clear to auscultation       Cardiovascular negative cardio ROS Normal cardiovascular exam Rhythm:regular Rate:Normal     Neuro/Psych  Headaches, negative psych ROS   GI/Hepatic negative GI ROS, Neg liver ROS,   Endo/Other  negative endocrine ROS  Renal/GU negative Renal ROS     Musculoskeletal   Abdominal (+) + obese,   Peds  Hematology HLD   Anesthesia Other Findings IUFD at 20 weeks or more of gestation  Reproductive/Obstetrics (+) Pregnancy                             Anesthesia Physical Anesthesia Plan  ASA: II  Anesthesia Plan: Epidural   Post-op Pain Management:    Induction:   PONV Risk Score and Plan:   Airway Management Planned:   Additional Equipment:   Intra-op Plan:   Post-operative Plan:   Informed Consent: I have reviewed the patients History and Physical, chart, labs and discussed the procedure including the risks, benefits and alternatives for the proposed anesthesia with the patient or authorized representative who has indicated his/her understanding and acceptance.       Plan Discussed with:   Anesthesia Plan Comments: (Pt with retained placenta after delivery- to OR for D&E. Has epidural in place which functioned well for labor. Will plan for procedure under epidural. )       Anesthesia Quick Evaluation

## 2020-10-18 NOTE — Progress Notes (Signed)
Follow up visit with pt RN, Candise Bowens, to check on status. RN gave update and informed chaplain that pt stated she was going to try to get some sleep.  WIll continue to follow.  Please page as further needs arise.  Maryanna Shape. Carley Hammed, M.Div. [facility] Chaplain Pager [phone] Office [phone]

## 2020-10-18 NOTE — Progress Notes (Signed)
Follow up visit with Virginia Singleton. Pt reports some relief since getting her epidural. Chaplain asked open ended questions to facilitate processing. Pt shared that she had begun making some arrangements for burial and had decided that she wants to hold her daughter and her son if he is in condition to do so. She named a fear that holding her might make it harder, but recognizes that if she had not taken pictures of her daughter Arna Medici at her 58 week birth, she might not remember what she looked like and did not want to lose that opportunity this time. Pt shared about some boundaries she's set to best protect her emotional and mental health during this difficult time. She has good support from friends and is trying to focus on one moment at a time. Chaplain affirmed the good and difficult work she is doing.  Will continue to follow.  Please page as further needs arise.  Virginia Singleton. Virginia Singleton, M.Div. [facility] Chaplain Pager [phone] Office [phone]

## 2020-10-18 NOTE — Progress Notes (Addendum)
Patient ID: Virginia Singleton, female   DOB: [birth year], 35 y.o.   MRN: [id number] Pt finally comfortable with epidural - had to have redosed.  VSS: 152/102, 74  GEN - sleeping now SVE - 1/90/-1.5 hours ago   A/P: G2P0101 with demise of di di twins          S/p cytotec x 3          Plan to start pitocin per protocol now          BP improving since pain better controlled

## 2020-10-19 ENCOUNTER — Encounter (HOSPITAL_COMMUNITY)
Admission: AD | Disposition: A | Discharge status: Home / Self Care | Payer: Self-pay | Source: Home / Self Care | Attending: Obstetrics and Gynecology

## 2020-10-19 ENCOUNTER — Encounter (HOSPITAL_COMMUNITY): Payer: Self-pay | Admitting: Obstetrics and Gynecology

## 2020-10-19 ENCOUNTER — Inpatient Hospital Stay (HOSPITAL_COMMUNITY): Payer: Medicaid Other

## 2020-10-19 DIAGNOSIS — O43219 Placenta accreta, unspecified trimester: Secondary | ICD-10-CM

## 2020-10-19 HISTORY — DX: Placenta accreta, unspecified trimester: O43.219

## 2020-10-19 HISTORY — PX: DILATION AND EVACUATION: SHX1459

## 2020-10-19 LAB — CBC
HCT: 37.7 % (ref 36.0–46.0)
Hemoglobin: 12.9 g/dL (ref 12.0–15.0)
MCH: 30.2 pg (ref 26.0–34.0)
MCHC: 34.2 g/dL (ref 30.0–36.0)
MCV: 88.3 fL (ref 80.0–100.0)
Platelets: 172 10*3/uL (ref 150–400)
RBC: 4.27 MIL/uL (ref 3.87–5.11)
RDW: 14.6 % (ref 11.5–15.5)
WBC: 12.1 10*3/uL — ABNORMAL HIGH (ref 4.0–10.5)
nRBC: 0 % (ref 0.0–0.2)

## 2020-10-19 SURGERY — DILATION AND EVACUATION, UTERUS
Anesthesia: Epidural

## 2020-10-19 MED ORDER — NIFEDIPINE ER OSMOTIC RELEASE 30 MG PO TB24
30.0000 mg | ORAL_TABLET | Freq: Once | ORAL | Status: AC
  Administered 2020-10-19: 30 mg via ORAL
  Filled 2020-10-19: qty 1

## 2020-10-19 MED ORDER — FENTANYL CITRATE (PF) 100 MCG/2ML IJ SOLN
INTRAMUSCULAR | Status: AC
  Filled 2020-10-19: qty 2

## 2020-10-19 MED ORDER — SODIUM CHLORIDE 0.9 % IV SOLN
3.0000 g | Freq: Once | INTRAVENOUS | Status: AC
  Administered 2020-10-19: 3 g via INTRAVENOUS
  Filled 2020-10-19: qty 3

## 2020-10-19 MED ORDER — MISOPROSTOL 200 MCG PO TABS
ORAL_TABLET | ORAL | Status: AC
  Filled 2020-10-19: qty 4

## 2020-10-19 MED ORDER — MENTHOL 3 MG MT LOZG
1.0000 | LOZENGE | OROMUCOSAL | Status: DC | PRN
  Administered 2020-10-20: 3 mg via ORAL
  Filled 2020-10-19: qty 9

## 2020-10-19 MED ORDER — SUCCINYLCHOLINE CHLORIDE 20 MG/ML IJ SOLN
INTRAMUSCULAR | Status: DC | PRN
  Administered 2020-10-19: 100 mg via INTRAVENOUS

## 2020-10-19 MED ORDER — ONDANSETRON HCL 4 MG PO TABS: 4.0000 mg | ORAL_TABLET | ORAL | Status: DC | PRN

## 2020-10-19 MED ORDER — COCONUT OIL OIL: 1.0000 "application " | TOPICAL_OIL | Status: DC | PRN

## 2020-10-19 MED ORDER — MISOPROSTOL 25 MCG QUARTER TABLET
ORAL_TABLET | ORAL | Status: DC | PRN
  Administered 2020-10-19: 800 ug via RECTAL

## 2020-10-19 MED ORDER — IBUPROFEN 600 MG PO TABS
600.0000 mg | ORAL_TABLET | Freq: Four times a day (QID) | ORAL | Status: DC
  Administered 2020-10-19 (×4): 600 mg via ORAL
  Filled 2020-10-19 (×5): qty 1

## 2020-10-19 MED ORDER — SENNOSIDES-DOCUSATE SODIUM 8.6-50 MG PO TABS: 2.0000 | ORAL_TABLET | Freq: Every day | ORAL | Status: DC

## 2020-10-19 MED ORDER — OXYCODONE HCL 5 MG PO TABS: 5.0000 mg | ORAL_TABLET | ORAL | Status: DC | PRN

## 2020-10-19 MED ORDER — ACETAMINOPHEN 325 MG PO TABS: 650.0000 mg | ORAL_TABLET | ORAL | Status: DC | PRN

## 2020-10-19 MED ORDER — DIPHENHYDRAMINE HCL 25 MG PO CAPS: 25.0000 mg | ORAL_CAPSULE | Freq: Four times a day (QID) | ORAL | Status: DC | PRN

## 2020-10-19 MED ORDER — SILVER NITRATE-POT NITRATE 75-25 % EX MISC
CUTANEOUS | Status: AC
  Filled 2020-10-19: qty 10

## 2020-10-19 MED ORDER — DEXAMETHASONE SODIUM PHOSPHATE 10 MG/ML IJ SOLN
INTRAMUSCULAR | Status: DC | PRN
  Administered 2020-10-19: 10 mg via INTRAVENOUS

## 2020-10-19 MED ORDER — ONDANSETRON HCL 4 MG/2ML IJ SOLN
INTRAMUSCULAR | Status: DC | PRN
  Administered 2020-10-19: 4 mg via INTRAVENOUS

## 2020-10-19 MED ORDER — FENTANYL CITRATE (PF) 100 MCG/2ML IJ SOLN
INTRAMUSCULAR | Status: DC | PRN
  Administered 2020-10-19: 100 ug via EPIDURAL

## 2020-10-19 MED ORDER — NIFEDIPINE ER OSMOTIC RELEASE 30 MG PO TB24
60.0000 mg | ORAL_TABLET | Freq: Every day | ORAL | Status: DC
  Administered 2020-10-20: 60 mg via ORAL
  Filled 2020-10-19: qty 2

## 2020-10-19 MED ORDER — AMISULPRIDE (ANTIEMETIC) 5 MG/2ML IV SOLN: 10.0000 mg | Freq: Once | INTRAVENOUS | Status: DC | PRN

## 2020-10-19 MED ORDER — DIBUCAINE (PERIANAL) 1 % EX OINT: 1.0000 "application " | TOPICAL_OINTMENT | CUTANEOUS | Status: DC | PRN

## 2020-10-19 MED ORDER — MIDAZOLAM HCL 2 MG/2ML IJ SOLN
INTRAMUSCULAR | Status: AC
  Filled 2020-10-19: qty 2

## 2020-10-19 MED ORDER — WITCH HAZEL-GLYCERIN EX PADS: 1.0000 "application " | MEDICATED_PAD | CUTANEOUS | Status: DC | PRN

## 2020-10-19 MED ORDER — SUCCINYLCHOLINE CHLORIDE 200 MG/10ML IV SOSY
PREFILLED_SYRINGE | INTRAVENOUS | Status: AC
  Filled 2020-10-19: qty 10

## 2020-10-19 MED ORDER — OXYTOCIN-SODIUM CHLORIDE 30-0.9 UT/500ML-% IV SOLN: 2.5000 [IU]/h | INTRAVENOUS | Status: DC | PRN

## 2020-10-19 MED ORDER — SIMETHICONE 80 MG PO CHEW: 80.0000 mg | CHEWABLE_TABLET | ORAL | Status: DC | PRN

## 2020-10-19 MED ORDER — FENTANYL CITRATE (PF) 100 MCG/2ML IJ SOLN: 25.0000 ug | INTRAMUSCULAR | Status: DC | PRN

## 2020-10-19 MED ORDER — OXYCODONE HCL 5 MG/5ML PO SOLN: 5.0000 mg | Freq: Once | ORAL | Status: DC | PRN

## 2020-10-19 MED ORDER — PROPOFOL 10 MG/ML IV BOLUS
INTRAVENOUS | Status: DC | PRN
  Administered 2020-10-19: 200 mg via INTRAVENOUS

## 2020-10-19 MED ORDER — SODIUM CHLORIDE 0.9 % IV SOLN
3.0000 g | Freq: Four times a day (QID) | INTRAVENOUS | Status: AC
  Administered 2020-10-19 (×3): 3 g via INTRAVENOUS
  Filled 2020-10-19 (×3): qty 8

## 2020-10-19 MED ORDER — ONDANSETRON HCL 4 MG/2ML IJ SOLN: 4.0000 mg | INTRAMUSCULAR | Status: DC | PRN

## 2020-10-19 MED ORDER — LIDOCAINE HCL (PF) 1 % IJ SOLN
INTRAMUSCULAR | Status: AC
  Filled 2020-10-19: qty 30

## 2020-10-19 MED ORDER — OXYCODONE HCL 5 MG PO TABS: 10.0000 mg | ORAL_TABLET | ORAL | Status: DC | PRN

## 2020-10-19 MED ORDER — ONDANSETRON HCL 4 MG/2ML IJ SOLN
INTRAMUSCULAR | Status: AC
  Filled 2020-10-19: qty 2

## 2020-10-19 MED ORDER — DEXAMETHASONE SODIUM PHOSPHATE 10 MG/ML IJ SOLN
INTRAMUSCULAR | Status: AC
  Filled 2020-10-19: qty 1

## 2020-10-19 MED ORDER — LACTATED RINGERS IV SOLN: INTRAVENOUS | Status: DC | PRN

## 2020-10-19 MED ORDER — MIDAZOLAM HCL 2 MG/2ML IJ SOLN
INTRAMUSCULAR | Status: DC | PRN
  Administered 2020-10-19 (×2): 1 mg via INTRAVENOUS

## 2020-10-19 MED ORDER — LIDOCAINE-EPINEPHRINE (PF) 2 %-1:200000 IJ SOLN
INTRAMUSCULAR | Status: DC | PRN
  Administered 2020-10-19 (×4): 5 mL via EPIDURAL

## 2020-10-19 MED ORDER — ACETAMINOPHEN 10 MG/ML IV SOLN: 1000.0000 mg | Freq: Once | INTRAVENOUS | Status: DC | PRN

## 2020-10-19 MED ORDER — PRENATAL MULTIVITAMIN CH
1.0000 | ORAL_TABLET | Freq: Every day | ORAL | Status: DC
  Administered 2020-10-19: 1 via ORAL
  Filled 2020-10-19: qty 1

## 2020-10-19 MED ORDER — FENTANYL CITRATE (PF) 100 MCG/2ML IJ SOLN
INTRAMUSCULAR | Status: DC | PRN
  Administered 2020-10-19 (×2): 50 ug via INTRAVENOUS

## 2020-10-19 MED ORDER — TETANUS-DIPHTH-ACELL PERTUSSIS 5-2.5-18.5 LF-MCG/0.5 IM SUSY: 0.5000 mL | PREFILLED_SYRINGE | Freq: Once | INTRAMUSCULAR | Status: DC

## 2020-10-19 MED ORDER — LACTATED RINGERS IV SOLN: INTRAVENOUS | Status: DC

## 2020-10-19 MED ORDER — ZOLPIDEM TARTRATE 5 MG PO TABS: 5.0000 mg | ORAL_TABLET | Freq: Every evening | ORAL | Status: DC | PRN

## 2020-10-19 MED ORDER — ONDANSETRON HCL 4 MG/2ML IJ SOLN: 4.0000 mg | Freq: Once | INTRAMUSCULAR | Status: DC | PRN

## 2020-10-19 MED ORDER — KETOROLAC TROMETHAMINE 30 MG/ML IJ SOLN
INTRAMUSCULAR | Status: DC | PRN
  Administered 2020-10-19 (×2): 15 mg via INTRAVENOUS

## 2020-10-19 MED ORDER — BENZOCAINE-MENTHOL 20-0.5 % EX AERO: 1.0000 "application " | INHALATION_SPRAY | CUTANEOUS | Status: DC | PRN

## 2020-10-19 MED ORDER — PROPOFOL 10 MG/ML IV BOLUS
INTRAVENOUS | Status: AC
  Filled 2020-10-19: qty 20

## 2020-10-19 MED ORDER — OXYCODONE HCL 5 MG PO TABS: 5.0000 mg | ORAL_TABLET | Freq: Once | ORAL | Status: DC | PRN

## 2020-10-19 MED ORDER — KETOROLAC TROMETHAMINE 30 MG/ML IJ SOLN
INTRAMUSCULAR | Status: AC
  Filled 2020-10-19: qty 1

## 2020-10-19 SURGICAL SUPPLY — 18 items
CATH ROBINSON RED A/P 16FR (CATHETERS) ×2 IMPLANT
CLOTH BEACON ORANGE TIMEOUT ST (SAFETY) ×2 IMPLANT
GLOVE BIO SURGEON STRL SZ 6.5 (GLOVE) ×2 IMPLANT
GLOVE BIOGEL PI IND STRL 7.0 (GLOVE) ×1 IMPLANT
GLOVE BIOGEL PI INDICATOR 7.0 (GLOVE) ×1
GOWN STRL REUS W/TWL LRG LVL3 (GOWN DISPOSABLE) ×4 IMPLANT
KIT BERKELEY 1ST TRIMESTER 3/8 (MISCELLANEOUS) ×2 IMPLANT
PACK VAGINAL MINOR WOMEN LF (CUSTOM PROCEDURE TRAY) ×2 IMPLANT
PAD OB MATERNITY 4.3X12.25 (PERSONAL CARE ITEMS) ×2 IMPLANT
PAD PREP 24X48 CUFFED NSTRL (MISCELLANEOUS) ×2 IMPLANT
SET BERKELEY SUCTION TUBING (SUCTIONS) ×4 IMPLANT
TOWEL OR 17X24 6PK STRL BLUE (TOWEL DISPOSABLE) ×4 IMPLANT
VACURETTE 10 RIGID CVD (CANNULA) IMPLANT
VACURETTE 16MM ASPIR CVD .5 (CANNULA) ×4 IMPLANT
VACURETTE 7MM CVD STRL WRAP (CANNULA) IMPLANT
VACURETTE 8 RIGID CVD (CANNULA) IMPLANT
VACURETTE 9 RIGID CVD (CANNULA) IMPLANT
WATER STERILE IRR 1000ML POUR (IV SOLUTION) ×4 IMPLANT

## 2020-10-19 NOTE — Lactation Note (Signed)
Lactation Consultation Note  Patient Name: Virginia Singleton WVPXT'G Date: 10/19/2020 Reason for consult: Follow-up assessment;Mother's request Age:35 y.o.   LC assisted Mother on steps to follow to diminish milk supply. Mom's breasts and the time of the visit were soft and compressible.   LC provided WIC nutritional support after a loss.  We reviewed what to do to diminish milk supply and relieve discomfort as her milk comes in.  All written instructions reviewed and handouts provided.  [facility] brochure of inpatient and outpatient support reviewed.  All questions answered at the time of the visit.    Maternal Data    Feeding    LATCH Score                    Lactation Tools Discussed/Used    Interventions Interventions: Expressed milk;Education  Discharge Discharge Education: Engorgement and breast care  Consult Status Consult Status: PRN    Cailyn Houdek  Nicholson-Springer 10/19/2020, 1:47 PM

## 2020-10-19 NOTE — Progress Notes (Signed)
  Follow up visit with Virginia Singleton who was being visited by two of her friends. Virginia Singleton reports she is doing okay right now and was hopeful to go home, but just found out she'll have to wait until morning. Chaplain provided brief grief support and resources for patient and her four year old daughter, Virginia Singleton, but will follow up in AM so pt can have time with support system. Please page as further needs arise.  Maryanna Shape. Carley Hammed, M.Div. [facility] Chaplain Pager [phone] Office [phone]      10/19/20 1720  Clinical Encounter Type  Visited With Patient and family together  Visit Type Follow-up  Spiritual Encounters  Spiritual Needs Emotional;Grief support

## 2020-10-19 NOTE — Anesthesia Postprocedure Evaluation (Signed)
Anesthesia Post Note  Patient: Forensic psychologist  Procedure(s) Performed: DILATATION AND EVACUATION (N/A )     Patient location during evaluation: Mother Baby Anesthesia Type: Epidural Level of consciousness: awake and alert Pain management: pain level controlled Vital Signs Assessment: post-procedure vital signs reviewed and stable Respiratory status: spontaneous breathing, nonlabored ventilation and respiratory function stable Cardiovascular status: stable Postop Assessment: no headache, no backache and epidural receding Anesthetic complications: no   No complications documented.  Last Vitals:  Vitals:   10/19/20 0814 10/19/20 1153  BP: (!) 126/91 121/87  Pulse: 72 78  Resp: 16 18  Temp: 37 C 36.9 C  SpO2: 99% 99%    Last Pain:  Vitals:   10/19/20 1215  TempSrc:   PainSc: 0-No pain   Pain Goal: Patients Stated Pain Goal: 3 (10/19/20 1215)                 Junious Silk

## 2020-10-19 NOTE — Anesthesia Postprocedure Evaluation (Signed)
Anesthesia Post Note  Patient: Forensic psychologist  Procedure(s) Performed: DILATATION AND EVACUATION (N/A )     Patient location during evaluation: PACU Anesthesia Type: Epidural and General Level of consciousness: awake and alert Pain management: pain level controlled Vital Signs Assessment: post-procedure vital signs reviewed and stable Respiratory status: spontaneous breathing, nonlabored ventilation and respiratory function stable Cardiovascular status: blood pressure returned to baseline and stable Postop Assessment: no apparent nausea or vomiting Anesthetic complications: no   No complications documented.  Last Vitals:  Vitals:   10/19/20 0344 10/19/20 0447  BP: (!) 137/91 131/90  Pulse: 73 75  Resp: 16 17  Temp: 37.3 C   SpO2: 99% 98%    Last Pain:  Vitals:   10/19/20 0450  TempSrc:   PainSc: 0-No pain   Pain Goal:                Epidural/Spinal Function Cutaneous sensation: Normal sensation (10/19/20 0450), Patient able to flex knees: Yes (10/19/20 0450), Patient able to lift hips off bed: Yes (10/19/20 0450), Back pain beyond tenderness at insertion site: No (10/19/20 0450), Progressively worsening motor and/or sensory loss: No (10/19/20 0450), Bowel and/or bladder incontinence post epidural: No (10/19/20 0450)  Virginia Singleton

## 2020-10-19 NOTE — Op Note (Signed)
Operative Note    Preoperative Diagnosis 1. Retained placenta 2. S/P SVD of preterm twins - demised    Postoperative Diagnosis:  1. Placenta accreta 2. S/P SVD of preterm twins - demised    Procedure: Ultrasound guided dilation and curettage to evacuate placenta accreta   Surgeon: Mickle Mallory DO   Anesthesia: General   Fluids: LR 1455m EBL: 2028mUOP: 40077m Findings: Anterior placenta with adherence to left and anterior uterus. Uterus                   sounded to 20+ cm   Specimen: Placental products   Procedure Note Consent was confirmed from  pt. Patient was taken to the operating room. Her epidural was up dosed however she had inadequate pain control after 14m97m At this time,general anesthesia was administered without difficulty. She was then prepped and draped in the normal sterile fashion while in the dorsal lithotomy position. An appropriate time out was performed.  An exam under anesthesia noted a slightly anteverted uterus.  A weighted speculum was then placed within the vagina and the anterior lip of the cervix identified and grasped with a single toothed tenaculum.  The tagged umbilical cord segments were first pulled with no apparent release noted from the placenta. Next using a pair of ring forceps, the placenta was attempted to be evacuated again with no success.  A banjo currette was tried next but also met with no success. At this time, US a[place]istance was requested.  Using US g[place]oreadance, the cord was noted to be attached to the placenta which appeared to be unusually attached to the fundal and anterior lateral segments of the placenta.  Alternating with the banjo curette, the ring forceps and the suction, the placental products were gradually removed.  Once the vast majority of products had been successfully evacuated, all instruments were removed. No bleeding was noted.  A dose of unasyn antibiotic was begun 800mc2m cytotec was placed rectally   Counts  were noted to be accurate. Thus, pt was taken to recovery room in stable condition Uterus was then sounded to 7.5cm.  The Pratt dilators were utilized to dilate the cervix up to approximately 23. A size 7 rigid cannula was then inserted into the uterine cavity and suction begun. There was small amount of products evacuated. After 4 passes, a sharp curettage was then performed. Four more passes with the suction cannula were done. Scant amount of products were returned this time. A final currettage noted a gritty texture in all four quadrants.  At this point, the tenaculum was removed; site was noted to be hemostatic.  There was scant to no bleeding from cervix. The speculum was thus removed as well. Pt was cleaned and awakened. She was taken to the recovery room in stable condition.  Counts were correct per nursing staff during and at the end of the procedure

## 2020-10-19 NOTE — Addendum Note (Signed)
Addendum  created 10/19/20 1354 by Junious Silk, CRNA   Clinical Note Signed

## 2020-10-19 NOTE — Transfer of Care (Signed)
Immediate Anesthesia Transfer of Care Note  Patient: Virginia Singleton  Procedure(s) Performed: DILATATION AND EVACUATION (N/A )  Patient Location: PACU  Anesthesia Type:General  Level of Consciousness: awake, alert  and patient cooperative  Airway & Oxygen Therapy: Patient Spontanous Breathing  Post-op Assessment: Report given to RN and Post -op Vital signs reviewed and stable  Post vital signs: Reviewed and stable  Last Vitals:  Vitals Value Taken Time  BP 136/81 10/19/20 0230  Temp 36.9 C 10/19/20 0228  Pulse 102 10/19/20 0232  Resp 16 10/19/20 0232  SpO2 97 % 10/19/20 0232  Vitals shown include unvalidated device data.  Last Pain:  Vitals:   10/19/20 0230  TempSrc:   PainSc: 0-No pain         Complications: No complications documented.

## 2020-10-19 NOTE — Progress Notes (Signed)
Patient ID: Virginia Singleton, female   DOB: 01-17-1986, 35 y.o.   MRN: [id number] Pt feeling tired, but otherwise no HA or PIH sx Got to visit with babies again and feels has had enough time with them.  Friends with her at bedside.  Bleeding WNL  BP 120-137/90's  Will give another dose of procardia XL 30mg   now and increase AM dose to 60 mg. Form signed for funeral home.

## 2020-10-19 NOTE — Anesthesia Procedure Notes (Signed)
Procedure Name: Intubation Date/Time: 10/19/2020 1:11 AM Performed by: Sandrea Matte, CRNA Pre-anesthesia Checklist: Patient identified, Emergency Drugs available, Suction available and Patient being monitored Patient Re-evaluated:Patient Re-evaluated prior to induction Oxygen Delivery Method: Circle system utilized Preoxygenation: Pre-oxygenation with 100% oxygen Induction Type: IV induction, Rapid sequence and Cricoid Pressure applied Laryngoscope Size: Mac and 4 Grade View: Grade I Tube type: Oral Number of attempts: 1 Airway Equipment and Method: Stylet and Oral airway Placement Confirmation: ETT inserted through vocal cords under direct vision,  positive ETCO2 and breath sounds checked- equal and bilateral Secured at: 22 cm Tube secured with: Tape Dental Injury: Teeth and Oropharynx as per pre-operative assessment

## 2020-10-19 NOTE — Progress Notes (Signed)
Patient ID: Virginia Singleton, female   DOB: 1985-11-15, 35 y.o.   MRN: [id number] Pt doing reasonably well. Right leg still a little numb but able to get on steady.  Bleeding normal, no pain.   She would like to hold the babies again. No HA or PIH sx  126-154/90-106  Fundus NT  Pt s/p NSVD and D&E for retained placenta/possible partial accreta   BP still elevated, started on procardia 30mg  at 10AM--will see how responds and titrate up  Will facilitate seeing babies again.

## 2020-10-20 MED ORDER — NIFEDIPINE ER 60 MG PO TB24
60.0000 mg | ORAL_TABLET | Freq: Every day | ORAL | 1 refills | Status: AC
Start: 2020-10-21 — End: ?

## 2020-10-20 MED ORDER — IBUPROFEN 600 MG PO TABS: 600.0000 mg | ORAL_TABLET | Freq: Four times a day (QID) | ORAL | 0 refills | Status: AC

## 2020-10-20 NOTE — Discharge Summary (Signed)
Postpartum Discharge Summary      Patient Name: Virginia Singleton DOB: [birth year] MRN: [id number]  Date of admission: 10/17/2020 Delivery date:   Howard, Patton [[id number]]  10/18/2020    Rosellen, Lichtenberger FD [[id number]]  10/18/2020   Delivering provider:    Remo Lipps FD [[id number]]  Pryor Ochoa [facility]    [place], [place] FD [[id number]]  Pryor Ochoa [facility]   Date of discharge: 10/20/2020  Admitting diagnosis: IUFD at 20 weeks or more of gestation [O36.4XX0] Intrauterine pregnancy: [redacted]w[redacted]d     Secondary diagnosis:  Active Problems:   IUFD at 20 weeks or more of gestation   SVD (spontaneous vaginal delivery)   Placenta accreta     Discharge diagnosis: Preterm Pregnancy Delivered, Gestational Hypertension and Twin pregnancy with IUFD both twins                                              Post partum procedures:curettage   Complications: [facility] course: Induction of Labor With Vaginal Delivery   35 y.o. yo G2P0201 at [redacted]w[redacted]d was admitted to the hospital 10/17/2020 for induction of labor.  Indication for induction: IUFD twins.  Patient had an uncomplicated labor course as follows: Membrane Rupture Time/Date:    Hollynn, Garno FD [[id number]]  6:32 PM    Kyliyah, Stirn FD [[id number]]  7:49 PM  ,   Carey, Lafon FD [[id number]]  10/18/2020    Krista, Godsil FD [[id number]]  10/18/2020    Delivery Method:   Remo Lipps FD [[id number]]  Vaginal, Spontaneous    Atianna, Haidar FD [[id number]]  Vaginal, Spontaneous   Episiotomy:    Unity, Luepke FD [[id number]]  None    Waynetta, Metheny FD [[id number]]  None   Lacerations:     Magenta, Schmiesing FD [[id number]]  None    Theressa, Piedra FD [[id number]]  None   Details of delivery can be found in separate delivery note.  She required D&C for retained placenta, possible placenta  accreta.  Patient had a routine postpartum course except required procardia to control BP. Patient is discharged home 10/20/20.  Newborn Data: Birth date:   Gilda, Abboud [[id number]]  10/18/2020    Chanette, Demo FD [[id number]]  10/18/2020   Birth time:   Teresha, Hanks FD [[id number]]  6:34 PM    Delaney, Perona FD [[id number]]  9:37 PM   Gender:   Renuka, Farfan FD [[id number]]  Female    Nabria, Nevin FD [[id number]]  Female   Living status:   Saul, Dorsi FD [[id number]]  Fetal Demise    Markita, Stcharles FD [[id number]]  Fetal Demise   Apgars:   Tritia, Endo FD [[id number]]  [address] FD [[id number]]  Momoko, Slezak FD [[id number]]  0    Antrice, Pal FD [[id number]]  0   Weight:   Deniya, Craigo FD [[id number]]  320 g    Dennice, Tindol FD [[id number]]  99 g     Physical exam  Vitals:   10/19/20 2017 10/19/20 2352 10/20/20 0428 10/20/20 0759  BP: 130/84 118/84 117/69 126/86  Pulse: 79 74 89 82  Resp: 16 17 18 18   Temp: 98.7 F (37.1 C) 98 F (36.7 C) 98.5 F (36.9 C) 98.2 F (36.8 C)  TempSrc: Oral Oral Oral Oral  SpO2: 98% 99% 98% 100%  Weight:  Height:       General: alert Lochia: appropriate Uterine Fundus: firm  Labs: Lab Results  Component Value Date   WBC 12.1 (H) 10/19/2020   HGB 12.9 10/19/2020   HCT 37.7 10/19/2020   MCV 88.3 10/19/2020   PLT 172 10/19/2020   CMP Latest Ref Rng & Units 10/17/2020  Glucose 70 - 99 mg/dL 75  BUN 6 - 20 mg/dL 11  Creatinine 3.81 - 8.29 mg/dL 9.37  Sodium 169 - 678 mmol/L 136  Potassium 3.5 - 5.1 mmol/L 4.0  Chloride 98 - 111 mmol/L 104  CO2 22 - 32 mmol/L 23  Calcium 8.9 - 10.3 mg/dL 9.0  Total Protein 6.5 - 8.1 g/dL 6.9  Total Bilirubin 0.3 - 1.2 mg/dL 0.5  Alkaline Phos 38 - 126 U/L 114  AST 15 - 41 U/L 21  ALT 0 - 44 U/L 18   Edinburgh  Score: No flowsheet data found.    After visit meds:  Allergies as of 10/20/2020      Reactions   Zithromax [azithromycin] Hives, Rash      Medication List    TAKE these medications   ibuprofen 600 MG tablet Commonly known as: ADVIL Take 1 tablet (600 mg total) by mouth every 6 (six) hours.   NIFEdipine 60 MG 24 hr tablet Commonly known as: ADALAT CC Take 1 tablet (60 mg total) by mouth daily. Start taking on: Oct 21, 2020   prenatal multivitamin Tabs tablet Take 1 tablet by mouth daily at 12 noon.        Discharge home in stable condition  Discharge instruction: per After Visit Summary and Postpartum booklet. Activity: Advance as tolerated. Pelvic rest for 6 weeks.  Diet: routine diet  Postpartum Appointment:4 days for BP check Follow up Visit:  Follow-up Information    Edwinna Areola, DO. Schedule an appointment as soon as possible for a visit in 4 day(s).   Specialty: Obstetrics and Gynecology Why: for BP check Contact information: [address] [place] [address] [place] [address] [phone]                   10/20/2020 Zenaida Niece, MD

## 2020-10-20 NOTE — Progress Notes (Signed)
PPD #2 Feels ok, some cramps Afeb, VSS, most BP are normal D/c home

## 2020-10-21 LAB — SURGICAL PATHOLOGY

## 2020-10-26 ENCOUNTER — Ambulatory Visit: Payer: Medicaid Other

## 2021-01-16 ENCOUNTER — Other Ambulatory Visit: Payer: Self-pay | Admitting: Obstetrics and Gynecology

## 2021-01-18 ENCOUNTER — Encounter (HOSPITAL_COMMUNITY): Payer: Self-pay

## 2021-01-18 ENCOUNTER — Inpatient Hospital Stay (HOSPITAL_COMMUNITY): Admit: 2021-01-18 | Payer: Medicaid Other | Admitting: Obstetrics and Gynecology

## 2021-01-18 SURGERY — Surgical Case
Anesthesia: Spinal

## 2021-02-06 ENCOUNTER — Other Ambulatory Visit: Payer: Medicaid Other

## 2021-03-17 ENCOUNTER — Other Ambulatory Visit: Payer: Medicaid Other

## 2021-04-19 ENCOUNTER — Ambulatory Visit
Admission: RE | Admit: 2021-04-19 | Discharge: 2021-04-19 | Disposition: A | Discharge status: Home / Self Care | Payer: Medicaid Other | Source: Ambulatory Visit | Attending: Obstetrics and Gynecology | Admitting: Obstetrics and Gynecology

## 2021-04-19 MED ORDER — GADOBENATE DIMEGLUMINE 529 MG/ML IV SOLN
20.0000 mL | Freq: Once | INTRAVENOUS | Status: AC | PRN
  Administered 2021-04-19: 20 mL via INTRAVENOUS

## 2022-08-16 IMAGING — MR MR PELVIS WO/W CM
20 of 21 series · 47 of 48 positions shown · IV contrast (multihance)
Comparison: None .

CLINICAL DATA: Evaluate for retained products of conception.

EXAM:
MRI PELVIS WITHOUT AND WITH CONTRAST
TECHNIQUE: Multiplanar multisequence MR imaging of the pelvis was performed
both before and after administration of intravenous contrast.
CONTRAST:  20mL MULTIHANCE GADOBENATE DIMEGLUMINE 529 MG/ML IV SOLN

[Series 2: T2 · coronal · 5.0mm · 1.25mm/px · 1 of 30 slices shown (1 of 4)]
[im 1/30]
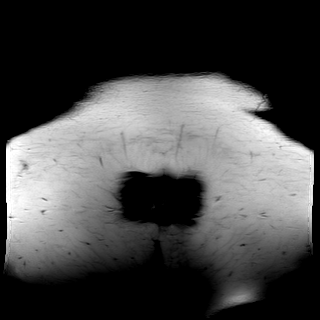

[Series 3: T2 · axial · 5.0mm · 0.41mm/px · 1 of 33 slices shown (2 of 4)]
[im 1/33]
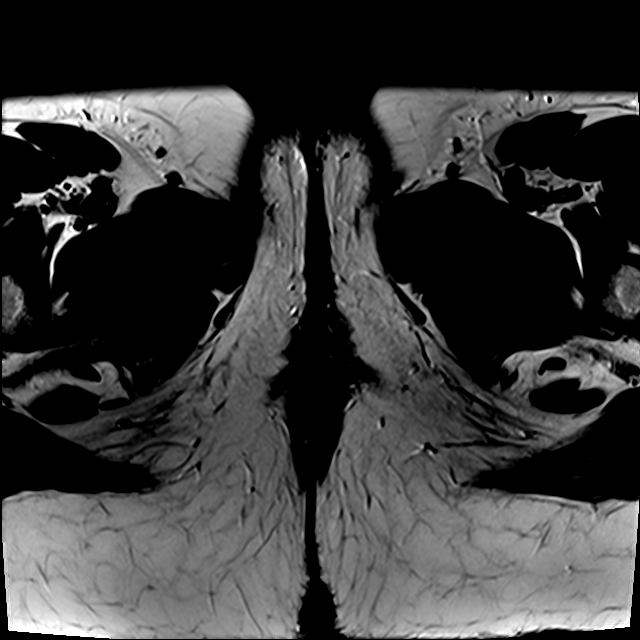

[Series 4: T2 fat-sat · axial · 5.0mm · 0.41mm/px · 1 of 33 slices shown]
[im 1/33]
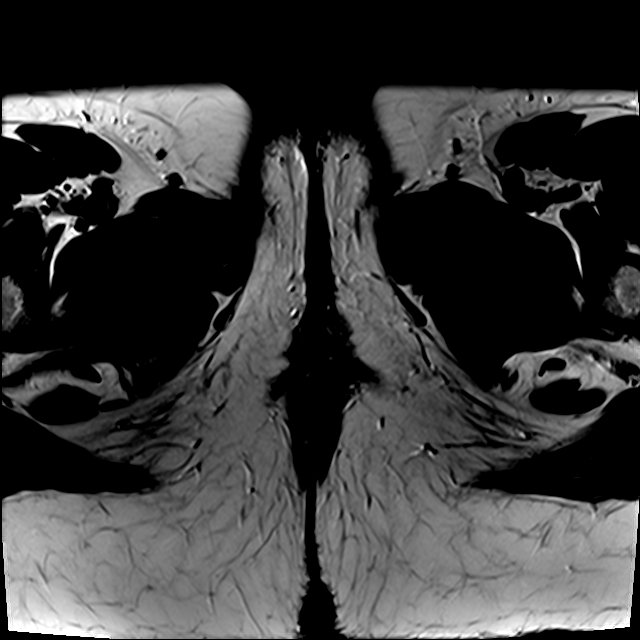

[Series 5: T2 · sagittal · 5.0mm · 0.78mm/px · 1 of 25 slices shown (3 of 4)]
[im 1/25]
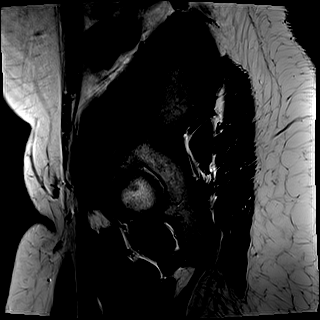

[Series 6: T2 · coronal · 5.0mm · 0.81mm/px · 1 of 30 slices shown (4 of 4)]
[im 1/30]
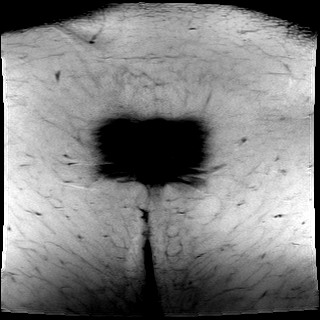

[Series 7: ax dwi_tracew_dfc · axial · 5.0mm · 1.19mm/px · z∈[-79,+131]mm · 3 of 108 slices shown]
[im 1/108]
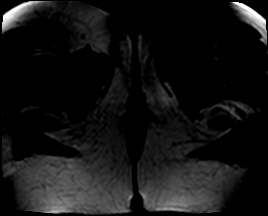
[im 54/108]
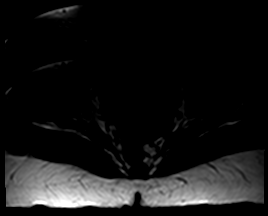
[im 108/108]
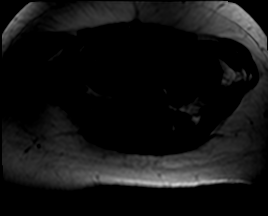

[Series 8: ax dwi_adc_dfc · axial · 5.0mm · 1.19mm/px · 1 of 36 slices shown]
[im 1/36]
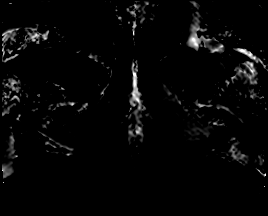

[Series 9: T1 · axial · 5.0mm · 1.25mm/px · z∈[-89,+146]mm · 2 of 96 slices shown]
[im 1/96]
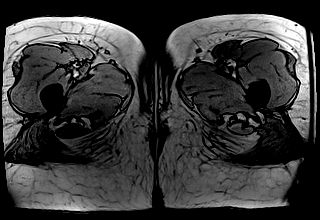
[im 96/96]
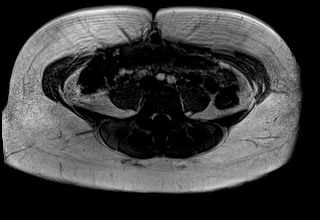

[Series 10: T1 dynamic · axial · non-contrast · 3.0mm · 1.25mm/px · z∈[-87,+150]mm · 2 of 80 slices shown]
[im 1/80]
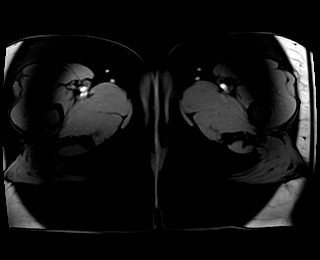
[im 80/80]
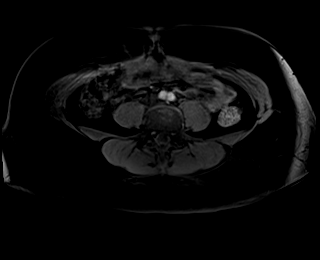

[Series 11: T1 dynamic post-contrast · axial · 3.0mm · 1.25mm/px · z∈[-87,+150]mm · 2 of 80 slices shown (1 of 8)]
[im 1/80]
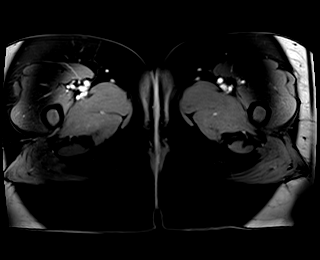
[im 80/80]
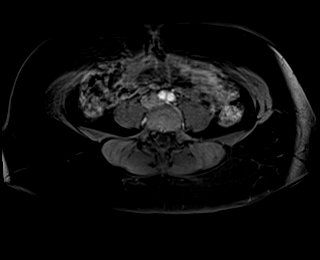

[Series 12: T1 dynamic post-contrast · axial · 3.0mm · 1.25mm/px · z∈[-87,+150]mm · 3 of 80 slices shown (2 of 8)]
[im 1/80]
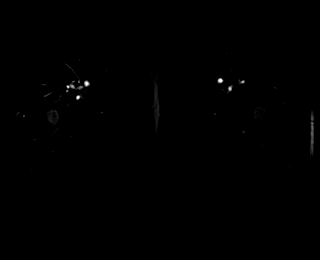
[im 40/80]
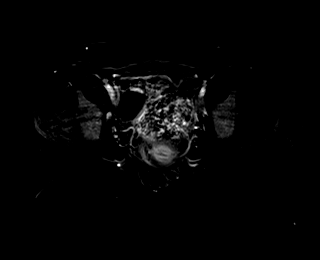
[im 80/80]
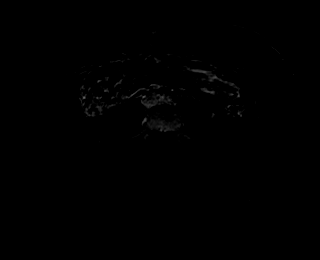

[Series 13: T1 dynamic post-contrast · axial · 3.0mm · 1.25mm/px · z∈[-87,+150]mm · 3 of 80 slices shown (3 of 8)]
[im 1/80]
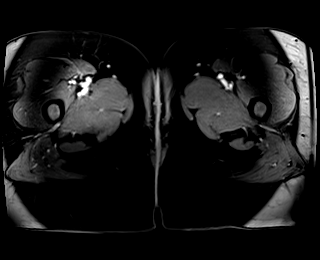
[im 40/80]
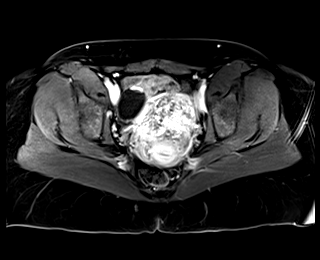
[im 80/80]
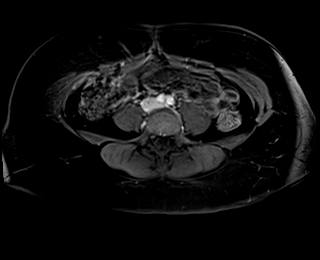

[Series 14: T1 dynamic post-contrast · axial · 3.0mm · 1.25mm/px · z∈[-81,+150]mm · 3 of 77 slices shown (4 of 8)]
[im 1/77]
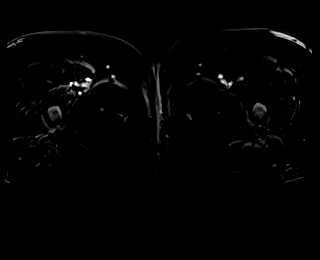
[im 39/77]
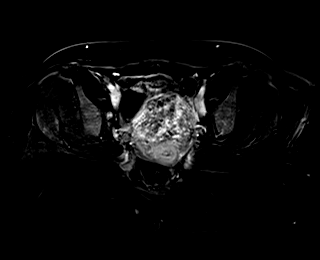
[im 77/77]
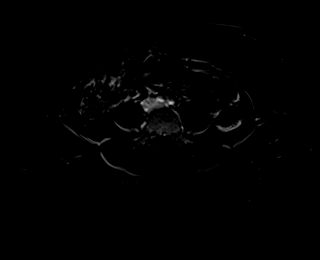

[Series 15: T1 dynamic post-contrast · axial · 3.0mm · 1.25mm/px · z∈[-87,+150]mm · 3 of 80 slices shown (5 of 8)]
[im 1/80]
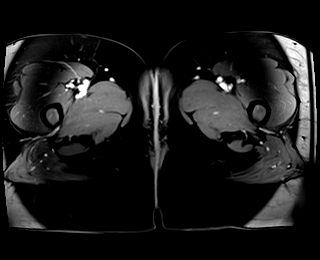
[im 40/80]
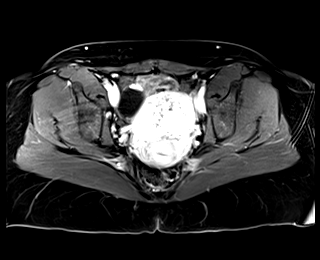
[im 80/80]
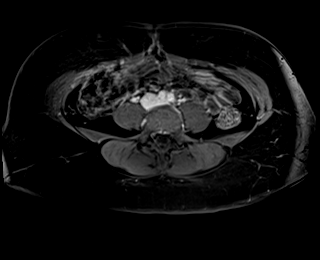

[Series 16: T1 dynamic post-contrast · axial · 3.0mm · 1.25mm/px · z∈[-87,+150]mm · 3 of 80 slices shown (6 of 8)]
[im 1/80]
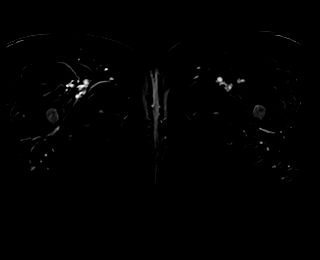
[im 40/80]
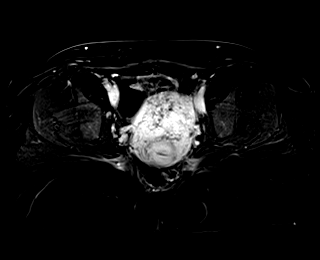
[im 80/80]
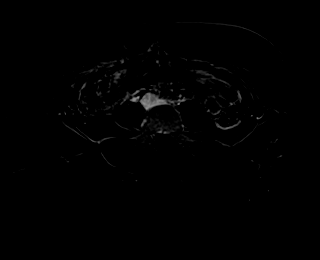

[Series 17: T1 dynamic post-contrast · axial · 3.0mm · 1.25mm/px · z∈[-87,+150]mm · 3 of 80 slices shown (7 of 8)]
[im 1/80]
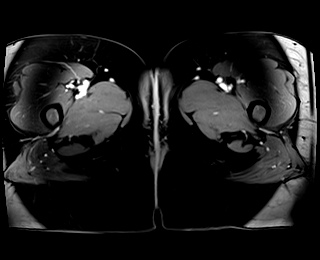
[im 40/80]
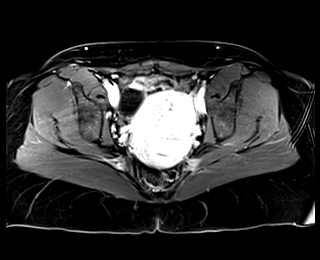
[im 80/80]
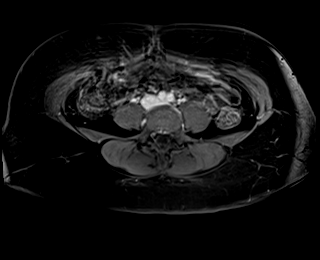

[Series 18: T1 dynamic post-contrast · axial · 3.0mm · 1.25mm/px · z∈[-87,+150]mm · 3 of 80 slices shown (8 of 8)]
[im 1/80]
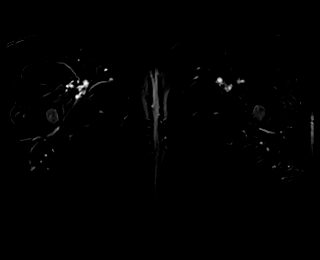
[im 40/80]
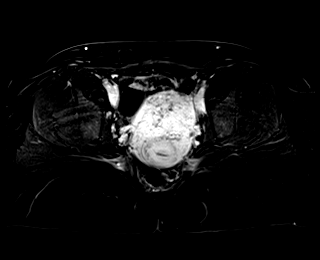
[im 80/80]
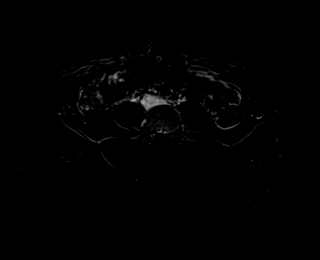

[Series 19: T1 fat-sat post-contrast · axial · 3.0mm · 1.25mm/px · z∈[-87,+150]mm · 3 of 80 slices shown (1 of 2)]
[im 1/80]
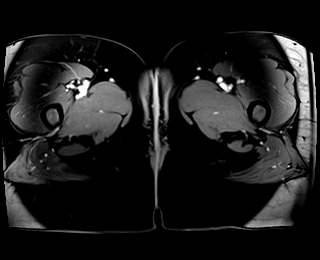
[im 40/80]
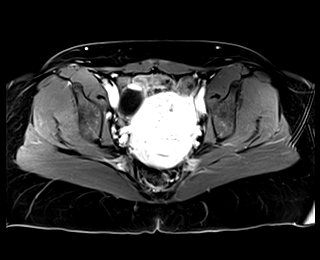
[im 80/80]
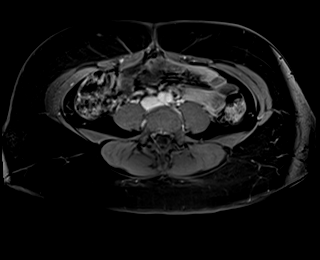

[Series 20: T1 fat-sat post-contrast · axial · 3.0mm · 1.25mm/px · z∈[-87,+150]mm · 3 of 80 slices shown (2 of 2)]
[im 1/80]
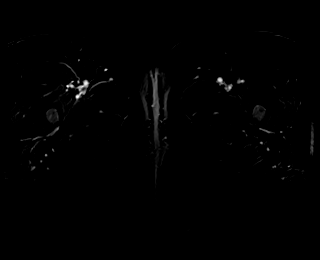
[im 40/80]
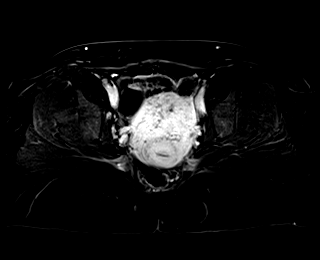
[im 80/80]
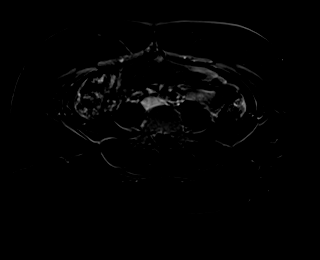

[Series 21: T1 fat-sat · sagittal · 1.2mm · 0.88mm/px · 5 of 144 slices shown]
[im 1/144]
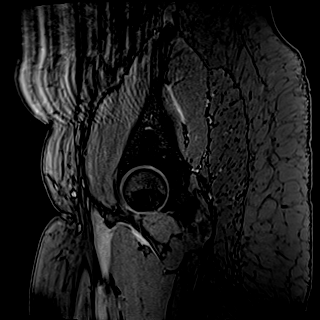
[im 36/144]
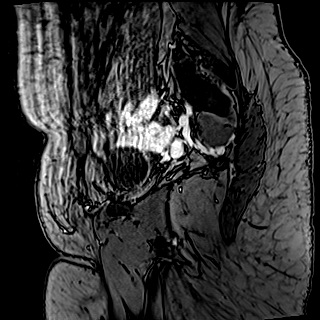
[im 72/144]
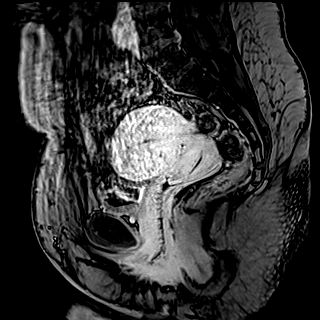
[im 108/144]
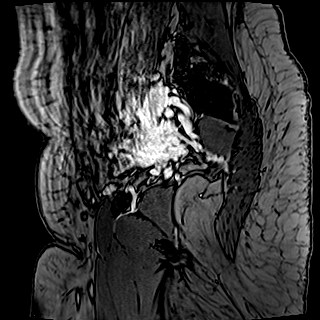
[im 144/144]
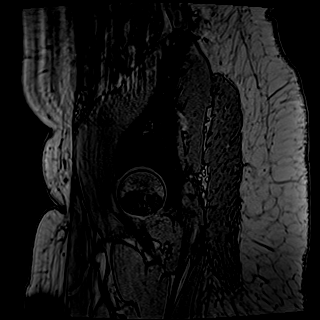

[47 of 48 positions shown; findings below may reference images not displayed]

FINDINGS: Urinary Tract:  Bladder is unremarkable.

Bowel:  Unremarkable visualized pelvic bowel loops.

Vascular/Lymphatic: Choose 1

Reproductive: Uterus measures 6.1 x 6.3 by 6.7 cm. Appears
anteflexed. C-section scar is identified within the anterior lower
uterine myometrium. No mass identified

Endometrium: Measures 2 mm, image [DATE]. No focal endometrial lesion
identified. No signs of retained products of conception.

Right ovary: Measures 5.2 by 4.0 by 5.7 cm (volume = 62 cm^3).
There is a simple appearing, uniformly T2 hyperintense cyst within
the right ovary measuring 4.2 cm, image [DATE]. No internal septation
or mural nodule identified.

Left ovary: Measures 3.2 x 2.5 by 3.7 cm (volume = 15 cm^3).
Simple appearing, uniformly T2 hyperintense cyst is identified
measuring 2.6 cm.

Other:  None.

Musculoskeletal: No suspicious bone lesions identified.
IMPRESSION: 1. No signs of retained products of conception. No focal endometrial
abnormality identified. If bleeding remains unresponsive to hormonal
or medical therapy, sonohysterogram should be considered for focal
lesion work-up. (Ref: Radiological Reasoning: Algorithmic Workup of
Abnormal Vaginal Bleeding with Endovaginal Sonography and
Sonohysterography. AJR [id number]; [id number]:S68-73)
2. Simple appearing bilateral ovarian cysts. No follow-up imaging
recommended.
